# Patient Record
Sex: Male | Born: 1940 | Race: White | Hispanic: No | Marital: Married | Smoking: Former smoker
Health system: Southern US, Community
[De-identification: ages and names within clinical notes are randomized; demographics above are authoritative.]

## PROBLEM LIST (undated history)

## (undated) DIAGNOSIS — R39198 Other difficulties with micturition: Secondary | ICD-10-CM

## (undated) DIAGNOSIS — H3530 Unspecified macular degeneration: Secondary | ICD-10-CM

## (undated) HISTORY — PX: KNEE ARTHROSCOPY: SHX127

## (undated) HISTORY — PX: CATARACT EXTRACTION W/ INTRAOCULAR LENS  IMPLANT, BILATERAL: SHX1307

---

## 2015-03-27 ENCOUNTER — Other Ambulatory Visit: Payer: Self-pay | Admitting: Orthopedic Surgery

## 2015-03-27 DIAGNOSIS — M1712 Unilateral primary osteoarthritis, left knee: Secondary | ICD-10-CM

## 2015-04-03 ENCOUNTER — Ambulatory Visit: Payer: Medicare Other

## 2015-04-04 ENCOUNTER — Ambulatory Visit
Admission: RE | Admit: 2015-04-04 | Discharge: 2015-04-04 | Disposition: A | Discharge status: Home / Self Care | Payer: Medicare Other | Source: Ambulatory Visit | Attending: Orthopedic Surgery | Admitting: Orthopedic Surgery

## 2015-04-04 DIAGNOSIS — M1712 Unilateral primary osteoarthritis, left knee: Secondary | ICD-10-CM | POA: Insufficient documentation

## 2015-04-16 ENCOUNTER — Encounter
Admission: RE | Admit: 2015-04-16 | Discharge: 2015-04-16 | Disposition: A | Discharge status: Home / Self Care | Payer: Medicare Other | Source: Ambulatory Visit | Attending: Orthopedic Surgery | Admitting: Orthopedic Surgery

## 2015-04-16 ENCOUNTER — Inpatient Hospital Stay: Admission: RE | Admit: 2015-04-16 | Payer: Medicare Other | Source: Ambulatory Visit

## 2015-04-16 DIAGNOSIS — Z01812 Encounter for preprocedural laboratory examination: Secondary | ICD-10-CM | POA: Diagnosis present

## 2015-04-16 DIAGNOSIS — M1712 Unilateral primary osteoarthritis, left knee: Secondary | ICD-10-CM | POA: Diagnosis not present

## 2015-04-16 HISTORY — DX: Other difficulties with micturition: R39.198

## 2015-04-16 HISTORY — DX: Unspecified macular degeneration: H35.30

## 2015-04-16 LAB — CBC
HCT: 43.5 % (ref 40.0–52.0)
Hemoglobin: 14.7 g/dL (ref 13.0–18.0)
MCH: 30.6 pg (ref 26.0–34.0)
MCHC: 33.8 g/dL (ref 32.0–36.0)
MCV: 90.5 fL (ref 80.0–100.0)
PLATELETS: 188 10*3/uL (ref 150–440)
RBC: 4.81 MIL/uL (ref 4.40–5.90)
RDW: 13.1 % (ref 11.5–14.5)
WBC: 8.5 10*3/uL (ref 3.8–10.6)

## 2015-04-16 LAB — BASIC METABOLIC PANEL
Anion gap: 8 (ref 5–15)
BUN: 18 mg/dL (ref 6–20)
CALCIUM: 9.8 mg/dL (ref 8.9–10.3)
CO2: 25 mmol/L (ref 22–32)
Chloride: 107 mmol/L (ref 101–111)
Creatinine, Ser: 0.85 mg/dL (ref 0.61–1.24)
GFR calc Af Amer: >60 mL/min (ref >60)
GLUCOSE: 94 mg/dL (ref 65–99)
POTASSIUM: 4.3 mmol/L (ref 3.5–5.1)
Sodium: 140 mmol/L (ref 135–145)

## 2015-04-16 LAB — URINALYSIS COMPLETE WITH MICROSCOPIC (ARMC ONLY)
BACTERIA UA: NONE SEEN
Bilirubin Urine: NEGATIVE
Glucose, UA: NEGATIVE mg/dL
HGB URINE DIPSTICK: NEGATIVE
Ketones, ur: NEGATIVE mg/dL
LEUKOCYTES UA: NEGATIVE
NITRITE: NEGATIVE
PROTEIN: NEGATIVE mg/dL
SPECIFIC GRAVITY, URINE: 1.018 (ref 1.005–1.030)
pH: 5 (ref 5.0–8.0)

## 2015-04-16 LAB — SEDIMENTATION RATE: SED RATE: 3 mm/h (ref 0–20)

## 2015-04-16 LAB — TYPE AND SCREEN
ABO/RH(D): A NEG
Antibody Screen: NEGATIVE

## 2015-04-16 LAB — PROTIME-INR
INR: 0.97
Prothrombin Time: 13.1 seconds (ref 11.4–15.0)

## 2015-04-16 LAB — SURGICAL PCR SCREEN
MRSA, PCR: NEGATIVE
Staphylococcus aureus: NEGATIVE

## 2015-04-16 LAB — ABO/RH: ABO/RH(D): A NEG

## 2015-04-16 LAB — APTT: aPTT: 29 seconds (ref 24–36)

## 2015-04-16 NOTE — Patient Instructions (Signed)
  Your procedure is scheduled on: 04/29/15 Tues  Report to Day Surgery.2nd floor medical mall To find out your arrival time please call [phone] between 1PM - 3PM on 04/28/15 Mon.  Remember: Instructions that are not followed completely may result in serious medical risk, up to and including death, or upon the discretion of your surgeon and anesthesiologist your surgery may need to be rescheduled.    __x__ 1. Do not eat food or drink liquids after midnight. No gum chewing or hard candies.     _x___ 2. No Alcohol for 24 hours before or after surgery.   ____ 3. Bring all medications with you on the day of surgery if instructed.    __x__ 4. Notify your doctor if there is any change in your medical condition     (cold, fever, infections).     Do not wear jewelry, make-up, hairpins, clips or nail polish.  Do not wear lotions, powders, or perfumes. You may wear deodorant.  Do not shave 48 hours prior to surgery. Men may shave face and neck.  Do not bring valuables to the hospital.    [facility] is not responsible for any belongings or valuables.               Contacts, dentures or bridgework may not be worn into surgery.  Leave your suitcase in the car. After surgery it may be brought to your room.  For patients admitted to the hospital, discharge time is determined by your                treatment team.   Patients discharged the day of surgery will not be allowed to drive home.   Please read over the following fact sheets that you were given:   MRSA Information   __x__ Take these medicines the morning of surgery with A SIP OF WATER:    1. Tramadol  2. timolol (TIMOPTIC) 0.5 % ophthalmic solution  3.   4.  5.  6.  ____ Fleet Enema (as directed)   x____ Use CHG Soap as directed  ____ Use inhalers on the day of surgery  ____ Stop metformin 2 days prior to surgery    ____ Take 1/2 of usual insulin dose the night before surgery and none on the morning of surgery.   _x___  Stop Coumadin/Plavix/aspirin on stop aspirin 1 week before surgery  _x___ Stop Anti-inflammatories on naproxen (NAPROSYN) 375 MG tablet stop 1 week before surgery   ____ Stop supplements until after surgery.    ____ Bring C-Pap to the hospital.

## 2015-04-17 LAB — URINE CULTURE: Culture: 4000

## 2015-04-29 ENCOUNTER — Inpatient Hospital Stay: Payer: Medicare Other | Admitting: Anesthesiology

## 2015-04-29 ENCOUNTER — Inpatient Hospital Stay
Admission: RE | Admit: 2015-04-29 | Discharge: 2015-05-02 | DRG: 470 | Disposition: A | Discharge status: Skilled Nursing Facility | Payer: Medicare Other | Source: Ambulatory Visit | Attending: Orthopedic Surgery | Admitting: Orthopedic Surgery

## 2015-04-29 ENCOUNTER — Inpatient Hospital Stay: Payer: Medicare Other

## 2015-04-29 ENCOUNTER — Encounter
Admission: RE | Disposition: A | Discharge status: Skilled Nursing Facility | Payer: Self-pay | Source: Ambulatory Visit | Attending: Orthopedic Surgery

## 2015-04-29 DIAGNOSIS — E785 Hyperlipidemia, unspecified: Secondary | ICD-10-CM | POA: Diagnosis present

## 2015-04-29 DIAGNOSIS — M1710 Unilateral primary osteoarthritis, unspecified knee: Secondary | ICD-10-CM | POA: Diagnosis present

## 2015-04-29 DIAGNOSIS — D62 Acute posthemorrhagic anemia: Secondary | ICD-10-CM | POA: Diagnosis not present

## 2015-04-29 DIAGNOSIS — Z7982 Long term (current) use of aspirin: Secondary | ICD-10-CM

## 2015-04-29 DIAGNOSIS — G8918 Other acute postprocedural pain: Secondary | ICD-10-CM

## 2015-04-29 DIAGNOSIS — Z79891 Long term (current) use of opiate analgesic: Secondary | ICD-10-CM | POA: Diagnosis not present

## 2015-04-29 DIAGNOSIS — H3530 Unspecified macular degeneration: Secondary | ICD-10-CM | POA: Diagnosis present

## 2015-04-29 DIAGNOSIS — M1712 Unilateral primary osteoarthritis, left knee: Principal | ICD-10-CM | POA: Diagnosis present

## 2015-04-29 DIAGNOSIS — Z79899 Other long term (current) drug therapy: Secondary | ICD-10-CM | POA: Diagnosis not present

## 2015-04-29 HISTORY — PX: TOTAL KNEE ARTHROPLASTY: SHX125

## 2015-04-29 LAB — CREATININE, SERUM
CREATININE: 1.03 mg/dL (ref 0.61–1.24)
GFR calc Af Amer: >60 mL/min (ref >60)

## 2015-04-29 LAB — CBC
HCT: 39.5 % — ABNORMAL LOW (ref 40.0–52.0)
HEMOGLOBIN: 13.3 g/dL (ref 13.0–18.0)
MCH: 30.4 pg (ref 26.0–34.0)
MCHC: 33.6 g/dL (ref 32.0–36.0)
MCV: 90.6 fL (ref 80.0–100.0)
Platelets: 152 10*3/uL (ref 150–440)
RBC: 4.36 MIL/uL — ABNORMAL LOW (ref 4.40–5.90)
RDW: 13.2 % (ref 11.5–14.5)
WBC: 5.9 10*3/uL (ref 3.8–10.6)

## 2015-04-29 SURGERY — ARTHROPLASTY, KNEE, TOTAL
Anesthesia: Spinal | Laterality: Left | Wound class: Clean

## 2015-04-29 MED ORDER — ACETAMINOPHEN 325 MG PO TABS: 650.0000 mg | ORAL_TABLET | Freq: Four times a day (QID) | ORAL | Status: DC | PRN

## 2015-04-29 MED ORDER — ASPIRIN EC 81 MG PO TBEC
81.0000 mg | DELAYED_RELEASE_TABLET | Freq: Every day | ORAL | Status: DC
  Administered 2015-04-30 – 2015-05-02 (×3): 81 mg via ORAL
  Filled 2015-04-29 (×3): qty 1

## 2015-04-29 MED ORDER — FAMOTIDINE 20 MG PO TABS
ORAL_TABLET | ORAL | Status: AC
  Administered 2015-04-29: 20 mg via ORAL
  Filled 2015-04-29: qty 1

## 2015-04-29 MED ORDER — ENOXAPARIN SODIUM 30 MG/0.3ML ~~LOC~~ SOLN
30.0000 mg | Freq: Two times a day (BID) | SUBCUTANEOUS | Status: DC
  Administered 2015-04-30 – 2015-05-02 (×5): 30 mg via SUBCUTANEOUS
  Filled 2015-04-29 (×5): qty 0.3

## 2015-04-29 MED ORDER — SODIUM CHLORIDE 0.9 % IJ SOLN
INTRAMUSCULAR | Status: AC
  Filled 2015-04-29: qty 50

## 2015-04-29 MED ORDER — BUPIVACAINE-EPINEPHRINE (PF) 0.25% -1:200000 IJ SOLN
INTRAMUSCULAR | Status: AC
  Filled 2015-04-29: qty 30

## 2015-04-29 MED ORDER — SODIUM CHLORIDE 0.9 % IV SOLN
INTRAVENOUS | Status: DC
  Administered 2015-04-29 – 2015-04-30 (×2): via INTRAVENOUS

## 2015-04-29 MED ORDER — CEFAZOLIN SODIUM-DEXTROSE 2-3 GM-% IV SOLR
2.0000 g | Freq: Once | INTRAVENOUS | Status: AC
  Administered 2015-04-29: 2 g via INTRAVENOUS

## 2015-04-29 MED ORDER — BUPIVACAINE LIPOSOME 1.3 % IJ SUSP
INTRAMUSCULAR | Status: DC | PRN
  Administered 2015-04-29: 60 mL

## 2015-04-29 MED ORDER — METHOCARBAMOL 500 MG PO TABS
500.0000 mg | ORAL_TABLET | Freq: Four times a day (QID) | ORAL | Status: DC | PRN
  Administered 2015-04-29: 500 mg via ORAL
  Filled 2015-04-29: qty 1

## 2015-04-29 MED ORDER — PHENOL 1.4 % MT LIQD
1.0000 | OROMUCOSAL | Status: DC | PRN
  Filled 2015-04-29: qty 177

## 2015-04-29 MED ORDER — FENTANYL CITRATE (PF) 100 MCG/2ML IJ SOLN: 25.0000 ug | INTRAMUSCULAR | Status: DC | PRN

## 2015-04-29 MED ORDER — LACTATED RINGERS IV SOLN
INTRAVENOUS | Status: DC
  Administered 2015-04-29 (×2): via INTRAVENOUS

## 2015-04-29 MED ORDER — TRAMADOL HCL 50 MG PO TABS: 100.0000 mg | ORAL_TABLET | Freq: Four times a day (QID) | ORAL | Status: DC

## 2015-04-29 MED ORDER — NEOMYCIN-POLYMYXIN B GU 40-200000 IR SOLN
Status: DC | PRN
  Administered 2015-04-29: 12 mL

## 2015-04-29 MED ORDER — ACETAMINOPHEN 650 MG RE SUPP: 650.0000 mg | Freq: Four times a day (QID) | RECTAL | Status: DC | PRN

## 2015-04-29 MED ORDER — FENTANYL CITRATE (PF) 100 MCG/2ML IJ SOLN
INTRAMUSCULAR | Status: DC | PRN
  Administered 2015-04-29: 50 ug via INTRAVENOUS

## 2015-04-29 MED ORDER — ONDANSETRON HCL 4 MG/2ML IJ SOLN: 4.0000 mg | Freq: Once | INTRAMUSCULAR | Status: DC | PRN

## 2015-04-29 MED ORDER — METHOCARBAMOL 1000 MG/10ML IJ SOLN
500.0000 mg | Freq: Four times a day (QID) | INTRAVENOUS | Status: DC | PRN
  Filled 2015-04-29: qty 5

## 2015-04-29 MED ORDER — VITAMIN D (ERGOCALCIFEROL) 1.25 MG (50000 UNIT) PO CAPS
50000.0000 [IU] | ORAL_CAPSULE | ORAL | Status: DC
  Administered 2015-04-30: 50000 [IU] via ORAL
  Filled 2015-04-29: qty 1

## 2015-04-29 MED ORDER — DIPHENHYDRAMINE HCL 12.5 MG/5ML PO ELIX: 12.5000 mg | ORAL_SOLUTION | ORAL | Status: DC | PRN

## 2015-04-29 MED ORDER — TAMSULOSIN HCL 0.4 MG PO CAPS
0.4000 mg | ORAL_CAPSULE | Freq: Every day | ORAL | Status: DC
  Administered 2015-04-30 – 2015-05-02 (×3): 0.4 mg via ORAL
  Filled 2015-04-29 (×3): qty 1

## 2015-04-29 MED ORDER — BUPIVACAINE HCL (PF) 0.5 % IJ SOLN
INTRAMUSCULAR | Status: DC | PRN
  Administered 2015-04-29: 3 mL

## 2015-04-29 MED ORDER — METOCLOPRAMIDE HCL 5 MG/ML IJ SOLN: 5.0000 mg | Freq: Three times a day (TID) | INTRAMUSCULAR | Status: DC | PRN

## 2015-04-29 MED ORDER — TRANEXAMIC ACID 1000 MG/10ML IV SOLN
1000.0000 mg | INTRAVENOUS | Status: AC
  Administered 2015-04-29: 1000 mg via INTRAVENOUS
  Filled 2015-04-29: qty 10

## 2015-04-29 MED ORDER — MORPHINE SULFATE (PF) 2 MG/ML IV SOLN
2.0000 mg | INTRAVENOUS | Status: DC | PRN
  Administered 2015-04-30: 2 mg via INTRAVENOUS
  Filled 2015-04-29 (×2): qty 1

## 2015-04-29 MED ORDER — MIDAZOLAM HCL 5 MG/5ML IJ SOLN
INTRAMUSCULAR | Status: DC | PRN
  Administered 2015-04-29 (×2): 1 mg via INTRAVENOUS

## 2015-04-29 MED ORDER — KETOROLAC TROMETHAMINE 30 MG/ML IJ SOLN: INTRAMUSCULAR | Status: DC | PRN

## 2015-04-29 MED ORDER — MENTHOL 3 MG MT LOZG
1.0000 | LOZENGE | OROMUCOSAL | Status: DC | PRN
  Filled 2015-04-29: qty 9

## 2015-04-29 MED ORDER — DOCUSATE SODIUM 100 MG PO CAPS
100.0000 mg | ORAL_CAPSULE | Freq: Two times a day (BID) | ORAL | Status: DC
  Administered 2015-04-29 – 2015-05-02 (×6): 100 mg via ORAL
  Filled 2015-04-29 (×6): qty 1

## 2015-04-29 MED ORDER — PROPOFOL 500 MG/50ML IV EMUL
INTRAVENOUS | Status: DC | PRN
  Administered 2015-04-29: 75 ug/kg/min via INTRAVENOUS

## 2015-04-29 MED ORDER — FAMOTIDINE 20 MG PO TABS
20.0000 mg | ORAL_TABLET | Freq: Once | ORAL | Status: AC
  Administered 2015-04-29: 20 mg via ORAL

## 2015-04-29 MED ORDER — BISACODYL 5 MG PO TBEC
5.0000 mg | DELAYED_RELEASE_TABLET | Freq: Every day | ORAL | Status: DC | PRN
  Administered 2015-05-01: 5 mg via ORAL
  Filled 2015-04-29: qty 1

## 2015-04-29 MED ORDER — LIDOCAINE HCL (CARDIAC) 20 MG/ML IV SOLN
INTRAVENOUS | Status: DC | PRN
  Administered 2015-04-29: 80 mg via INTRAVENOUS

## 2015-04-29 MED ORDER — SODIUM CHLORIDE FLUSH 0.9 % IV SOLN
INTRAVENOUS | Status: AC
  Filled 2015-04-29: qty 3

## 2015-04-29 MED ORDER — ATORVASTATIN CALCIUM 20 MG PO TABS
80.0000 mg | ORAL_TABLET | Freq: Every day | ORAL | Status: DC
  Administered 2015-04-29 – 2015-05-01 (×3): 80 mg via ORAL
  Filled 2015-04-29 (×3): qty 4

## 2015-04-29 MED ORDER — NEOMYCIN-POLYMYXIN B GU 40-200000 IR SOLN
Status: AC
  Filled 2015-04-29: qty 20

## 2015-04-29 MED ORDER — MAGNESIUM HYDROXIDE 400 MG/5ML PO SUSP
30.0000 mL | Freq: Every day | ORAL | Status: DC | PRN
  Administered 2015-04-30 – 2015-05-01 (×2): 30 mL via ORAL
  Filled 2015-04-29 (×2): qty 30

## 2015-04-29 MED ORDER — ONDANSETRON HCL 4 MG PO TABS: 4.0000 mg | ORAL_TABLET | Freq: Four times a day (QID) | ORAL | Status: DC | PRN

## 2015-04-29 MED ORDER — OXYCODONE HCL 5 MG PO TABS
5.0000 mg | ORAL_TABLET | ORAL | Status: DC | PRN
  Administered 2015-04-29: 10 mg via ORAL
  Administered 2015-04-29 (×2): 5 mg via ORAL
  Administered 2015-04-30 – 2015-05-01 (×9): 10 mg via ORAL
  Administered 2015-05-02 (×2): 5 mg via ORAL
  Filled 2015-04-29 (×2): qty 2
  Filled 2015-04-29: qty 1
  Filled 2015-04-29 (×4): qty 2
  Filled 2015-04-29: qty 1
  Filled 2015-04-29 (×2): qty 2
  Filled 2015-04-29: qty 1
  Filled 2015-04-29: qty 2
  Filled 2015-04-29 (×2): qty 1
  Filled 2015-04-29: qty 2

## 2015-04-29 MED ORDER — METOCLOPRAMIDE HCL 5 MG PO TABS: 5.0000 mg | ORAL_TABLET | Freq: Three times a day (TID) | ORAL | Status: DC | PRN

## 2015-04-29 MED ORDER — ZOLPIDEM TARTRATE 5 MG PO TABS: 5.0000 mg | ORAL_TABLET | Freq: Every evening | ORAL | Status: DC | PRN

## 2015-04-29 MED ORDER — EPHEDRINE SULFATE 50 MG/ML IJ SOLN
INTRAMUSCULAR | Status: DC | PRN
  Administered 2015-04-29 (×2): 10 mg via INTRAVENOUS

## 2015-04-29 MED ORDER — TIMOLOL MALEATE 0.5 % OP SOLN
1.0000 [drp] | Freq: Two times a day (BID) | OPHTHALMIC | Status: DC
Start: 2015-04-29 — End: 2015-05-02
  Administered 2015-04-29 – 2015-05-02 (×6): 1 [drp] via OPHTHALMIC
  Filled 2015-04-29 (×2): qty 5

## 2015-04-29 MED ORDER — CEFAZOLIN SODIUM-DEXTROSE 2-3 GM-% IV SOLR
INTRAVENOUS | Status: AC
  Filled 2015-04-29: qty 50

## 2015-04-29 MED ORDER — FINASTERIDE 5 MG PO TABS
5.0000 mg | ORAL_TABLET | Freq: Every day | ORAL | Status: DC
  Administered 2015-04-30 – 2015-05-02 (×3): 5 mg via ORAL
  Filled 2015-04-29 (×3): qty 1

## 2015-04-29 MED ORDER — SODIUM CHLORIDE 0.9 % IJ SOLN
INTRAMUSCULAR | Status: DC | PRN
  Administered 2015-04-29: 60 mL via INTRAVENOUS

## 2015-04-29 MED ORDER — MORPHINE SULFATE (PF) 10 MG/ML IV SOLN
INTRAVENOUS | Status: AC
  Filled 2015-04-29: qty 1

## 2015-04-29 MED ORDER — CEFAZOLIN SODIUM-DEXTROSE 2-3 GM-% IV SOLR
2.0000 g | Freq: Four times a day (QID) | INTRAVENOUS | Status: AC
  Administered 2015-04-29 (×2): 2 g via INTRAVENOUS
  Filled 2015-04-29 (×2): qty 50

## 2015-04-29 MED ORDER — MAGNESIUM CITRATE PO SOLN
1.0000 | Freq: Once | ORAL | Status: AC | PRN
  Administered 2015-05-01: 1 via ORAL
  Filled 2015-04-29 (×2): qty 296

## 2015-04-29 MED ORDER — ONDANSETRON HCL 4 MG/2ML IJ SOLN: 4.0000 mg | Freq: Four times a day (QID) | INTRAMUSCULAR | Status: DC | PRN

## 2015-04-29 MED ORDER — BUPIVACAINE LIPOSOME 1.3 % IJ SUSP
INTRAMUSCULAR | Status: AC
  Filled 2015-04-29: qty 20

## 2015-04-29 SURGICAL SUPPLY — 54 items
BANDAGE ACE 6X5 VEL STRL LF (GAUZE/BANDAGES/DRESSINGS) ×3 IMPLANT
BLADE SAW 1 (BLADE) ×3 IMPLANT
BLOCK CUTTING TIBIAL 4 MED (MISCELLANEOUS) IMPLANT
BLOCK CUTTING TIBIAL 5 LT (MISCELLANEOUS) IMPLANT
CANISTER SUCT 1200ML W/VALVE (MISCELLANEOUS) ×3 IMPLANT
CANISTER SUCT 3000ML (MISCELLANEOUS) ×6 IMPLANT
CAPT KNEE TOTAL 3 ×3 IMPLANT
CATH FOL LEG HOLDER (MISCELLANEOUS) ×3 IMPLANT
CATH TRAY METER 16FR LF (MISCELLANEOUS) ×3 IMPLANT
CEMENT HV SMART SET (Cement) ×6 IMPLANT
CHLORAPREP W/TINT 26ML (MISCELLANEOUS) ×6 IMPLANT
COOLER POLAR GLACIER W/PUMP (MISCELLANEOUS) ×3 IMPLANT
DRAPE INCISE IOBAN 66X45 STRL (DRAPES) ×6 IMPLANT
DRAPE SHEET LG 3/4 BI-LAMINATE (DRAPES) ×6 IMPLANT
ELECT CAUTERY BLADE 6.4 (BLADE) ×3 IMPLANT
ELECT REM PT RETURN 9FT ADLT (ELECTROSURGICAL) ×3
ELECTRODE REM PT RTRN 9FT ADLT (ELECTROSURGICAL) ×1 IMPLANT
GAUZE PETRO XEROFOAM 1X8 (MISCELLANEOUS) ×3 IMPLANT
GAUZE SPONGE 4X4 12PLY STRL (GAUZE/BANDAGES/DRESSINGS) ×3 IMPLANT
GLOVE BIOGEL PI IND STRL 9 (GLOVE) ×1 IMPLANT
GLOVE BIOGEL PI INDICATOR 9 (GLOVE) ×2
GLOVE SURG ORTHO 9.0 STRL STRW (GLOVE) ×3 IMPLANT
GOWN SPECIALTY ULTRA XL (MISCELLANEOUS) ×3 IMPLANT
GOWN STRL REUS W/ TWL LRG LVL3 (GOWN DISPOSABLE) ×2 IMPLANT
GOWN STRL REUS W/TWL LRG LVL3 (GOWN DISPOSABLE) ×4
HANDPIECE SUCTION TUBG SURGILV (MISCELLANEOUS) ×3 IMPLANT
HOOD PEEL AWAY FLYTE STAYCOOL (MISCELLANEOUS) ×6 IMPLANT
IMMBOLIZER KNEE 19 BLUE UNIV (SOFTGOODS) ×3 IMPLANT
IV SET EXTENSION 6 LL TADAPT (SET/KITS/TRAYS/PACK) ×3 IMPLANT
KNEE MEDACTA TIBIAL/FEMORAL BL (Knees) ×3 IMPLANT
KNIFE SCULPS 14X20 (INSTRUMENTS) ×3 IMPLANT
NDL SAFETY 18GX1.5 (NEEDLE) ×3 IMPLANT
NEEDLE SPNL 18GX3.5 QUINCKE PK (NEEDLE) ×3 IMPLANT
NEEDLE SPNL 20GX3.5 QUINCKE YW (NEEDLE) ×3 IMPLANT
NS IRRIG 1000ML POUR BTL (IV SOLUTION) ×3 IMPLANT
PACK TOTAL KNEE (MISCELLANEOUS) ×3 IMPLANT
PAD WRAPON POLAR KNEE (MISCELLANEOUS) ×1 IMPLANT
SOL .9 NS 3000ML IRR  AL (IV SOLUTION) ×2
SOL .9 NS 3000ML IRR UROMATIC (IV SOLUTION) ×1 IMPLANT
STAPLER SKIN PROX 35W (STAPLE) ×3 IMPLANT
STEM EXTENSION 11MMX30MM (Stem) ×3 IMPLANT
STRAP SAFETY BODY (MISCELLANEOUS) ×3 IMPLANT
SUCTION FRAZIER HANDLE 10FR (MISCELLANEOUS) ×2
SUCTION TUBE FRAZIER 10FR DISP (MISCELLANEOUS) ×1 IMPLANT
SUT DVC 2 QUILL PDO  T11 36X36 (SUTURE) ×2
SUT DVC 2 QUILL PDO T11 36X36 (SUTURE) ×1 IMPLANT
SUT DVC QUILL MONODERM 30X30 (SUTURE) ×3 IMPLANT
SUT ETHIBOND NAB CT1 #1 30IN (SUTURE) ×3 IMPLANT
SYR 20CC LL (SYRINGE) ×3 IMPLANT
SYR 50ML LL SCALE MARK (SYRINGE) ×3 IMPLANT
TIBIAL BONE MODEL LEFT (MISCELLANEOUS) IMPLANT
TOWER CARTRIDGE SMART MIX (DISPOSABLE) ×3 IMPLANT
WATER STERILE IRR 1000ML POUR (IV SOLUTION) ×3 IMPLANT
WRAPON POLAR PAD KNEE (MISCELLANEOUS) ×3

## 2015-04-29 NOTE — Anesthesia Procedure Notes (Signed)
Spinal Patient location during procedure: OR Staffing Anesthesiologist: Berdine Addison Performed by: anesthesiologist  Preanesthetic Checklist Completed: patient identified, site marked, surgical consent, pre-op evaluation, timeout performed, IV checked and risks and benefits discussed Spinal Block Patient position: sitting Prep: Betadine Patient monitoring: heart rate, cardiac monitor, continuous pulse ox and blood pressure Approach: midline Location: L3-4 Injection technique: single-shot Needle Needle type: Pencil-Tip  Needle gauge: 25 G Needle length: 9 cm Assessment Sensory level: T10 Additional Notes 0.5% marcaine 3 ml at 0722 am.

## 2015-04-29 NOTE — OR Nursing (Signed)
Sacral pad sent to OR 

## 2015-04-29 NOTE — NC FL2 (Signed)
[place] MEDICAID FL2 LEVEL OF CARE SCREENING TOOL     IDENTIFICATION  Patient Name: Jordan Wilkins Birthdate: Nov 08, 1940 Sex: male Admission Date (Current Location): 04/29/2015  [place] and [place] Number:   Octavio Mann[place], [place] )   Facility and Address:  [facility], [address], [place], [place] [address]      Provider Number: [id number]  Attending Physician Name and Address:  Kennedy Bucker, MD  Relative Name and Phone Number:       Current Level of Care: Hospital Recommended Level of Care: Skilled Nursing Facility Prior Approval Number:    Date Approved/Denied:   PASRR Number:    Discharge Plan: SNF    Current Diagnoses: Patient Active Problem List   Diagnosis Date Noted  . Primary osteoarthritis of knee 04/29/2015  Hyperlipidemia, unspecified  Orientation RESPIRATION BLADDER Height & Weight     Self, Time, Situation, Place  O2 (2 Liters Oxygen ) Continent Weight: 250 lb (113.399 kg) Height:   (177.8 cm)  BEHAVIORAL SYMPTOMS/MOOD NEUROLOGICAL BOWEL NUTRITION STATUS   (none )  (none ) Continent Diet (Diet: Clear Liquid )  AMBULATORY STATUS COMMUNICATION OF NEEDS Skin   Extensive Assist Verbally Surgical wounds (Incision: Left Knee )                       Personal Care Assistance Level of Assistance  Bathing, Feeding, Dressing Bathing Assistance: Limited assistance Feeding assistance: Independent Dressing Assistance: Limited assistance     Functional Limitations Info  Sight, Hearing, Speech Sight Info: Adequate Hearing Info: Adequate Speech Info: Adequate    SPECIAL CARE FACTORS FREQUENCY  PT (By licensed PT), OT (By licensed OT)     PT Frequency:  (5) OT Frequency:  (5)            Contractures      Additional Factors Info  Code Status Code Status Info:  (Full Code. )             Current Medications (04/29/2015):  This is the current hospital active medication list Current Facility-Administered  Medications  Medication Dose Route Frequency Provider Last Rate Last Dose  . 0.9 %  sodium chloride infusion   Intravenous Continuous Kennedy Bucker, MD      . acetaminophen (TYLENOL) tablet 650 mg  650 mg Oral Q6H PRN Kennedy Bucker, MD       Or  . acetaminophen (TYLENOL) suppository 650 mg  650 mg Rectal Q6H PRN Kennedy Bucker, MD      . aspirin EC tablet 81 mg  81 mg Oral Daily Kennedy Bucker, MD      . atorvastatin (LIPITOR) tablet 80 mg  80 mg Oral q1800 Kennedy Bucker, MD      . bisacodyl (DULCOLAX) EC tablet 5 mg  5 mg Oral Daily PRN Kennedy Bucker, MD      . ceFAZolin (ANCEF) 2-3 GM-% IVPB SOLR           . ceFAZolin (ANCEF) IVPB 2 g/50 mL premix  2 g Intravenous Q6H Kennedy Bucker, MD      . diphenhydrAMINE (BENADRYL) 12.5 MG/5ML elixir 12.5-25 mg  12.5-25 mg Oral Q4H PRN Kennedy Bucker, MD      . docusate sodium (COLACE) capsule 100 mg  100 mg Oral BID Kennedy Bucker, MD      . Melene Muller ON 04/30/2015] enoxaparin (LOVENOX) injection 30 mg  30 mg Subcutaneous Q12H Kennedy Bucker, MD      . finasteride (PROSCAR) tablet 5 mg  5 mg Oral Daily Kennedy Bucker, MD      . magnesium citrate solution 1 Bottle  1 Bottle Oral Once PRN Kennedy Bucker, MD      . magnesium hydroxide (MILK OF MAGNESIA) suspension 30 mL  30 mL Oral Daily PRN Kennedy Bucker, MD      . menthol-cetylpyridinium (CEPACOL) lozenge 3 mg  1 lozenge Oral PRN Kennedy Bucker, MD       Or  . phenol (CHLORASEPTIC) mouth spray 1 spray  1 spray Mouth/Throat PRN Kennedy Bucker, MD      . methocarbamol (ROBAXIN) tablet 500 mg  500 mg Oral Q6H PRN Kennedy Bucker, MD       Or  . methocarbamol (ROBAXIN) 500 mg in dextrose 5 % 50 mL IVPB  500 mg Intravenous Q6H PRN Kennedy Bucker, MD      . metoCLOPramide (REGLAN) tablet 5-10 mg  5-10 mg Oral Q8H PRN Kennedy Bucker, MD       Or  . metoCLOPramide (REGLAN) injection 5-10 mg  5-10 mg Intravenous Q8H PRN Kennedy Bucker, MD      . morphine 2 MG/ML injection 2 mg  2 mg Intravenous Q2H PRN Kennedy Bucker, MD      . ondansetron  [facility]) tablet 4 mg  4 mg Oral Q6H PRN Kennedy Bucker, MD       Or  . ondansetron [facility]) injection 4 mg  4 mg Intravenous Q6H PRN Kennedy Bucker, MD      . oxyCODONE (Oxy IR/ROXICODONE) immediate release tablet 5-10 mg  5-10 mg Oral Q3H PRN Kennedy Bucker, MD      . sodium chloride flush 0.9 % injection           . tamsulosin (FLOMAX) capsule 0.4 mg  0.4 mg Oral Daily Kennedy Bucker, MD      . timolol (TIMOPTIC) 0.5 % ophthalmic solution 1 drop  1 drop Left Eye BID Kennedy Bucker, MD      . traMADol Janean Sark) tablet 100 mg  100 mg Oral 4 times per day Kennedy Bucker, MD      . Vitamin D (Ergocalciferol) (DRISDOL) capsule 50,000 Units  50,000 Units Oral Weekly Kennedy Bucker, MD      . zolpidem [facility]) tablet 5 mg  5 mg Oral QHS PRN Kennedy Bucker, MD         Discharge Medications: Please see discharge summary for a list of discharge medications.  Relevant Imaging Results:  Relevant Lab Results:   Additional Information  (SSN: [id number])  Haig Prophet, LCSW

## 2015-04-29 NOTE — H&P (Signed)
Reviewed paper H+P, will be scanned into chart. No changes noted.  

## 2015-04-29 NOTE — Care Management (Addendum)
Rolling walker requested from [facility] with [facility]. Lovenox  #[phone] called in to [facility] for price.

## 2015-04-29 NOTE — Evaluation (Signed)
Physical Therapy Evaluation Patient Details Name: Jordan Wilkins MRN: [id number] DOB: June 11, 1940 Today's Date: 04/29/2015   History of Present Illness  Pt admitted for L TKR.   Clinical Impression  Pt is a pleasant 75 year old male who was admitted for L TKR. Pt performs bed mobility with min assist and transfers/ambulation with cga and rw. Pt demonstrates deficits with mobility/pain/strength. Pt is able to perform 10 SLRs without assistance, therefore does not require KI at this time. All mobility performed on room air with sats WNL. Would benefit from skilled PT to address above deficits and promote optimal return to PLOF.      Follow Up Recommendations Home health PT    Equipment Recommendations  Rolling walker with 5" wheels    Recommendations for Other Services       Precautions / Restrictions Precautions Precautions: Fall;Knee Precaution Booklet Issued: No Restrictions Weight Bearing Restrictions: Yes LLE Weight Bearing: Weight bearing as tolerated      Mobility  Bed Mobility Overal bed mobility: Needs Assistance Bed Mobility: Supine to Sit     Supine to sit: Min assist     General bed mobility comments: safe technique performed with cues for pushing on bed to scoot out to edge. Assist for sliding L LE to floor  Transfers Overall transfer level: Needs assistance Equipment used: Rolling walker (2 wheeled) Transfers: Sit to/from Stand Sit to Stand: Min guard         General transfer comment: transfers performed with rw and safe technique. Once standing, pt able to stand with supervision  Ambulation/Gait Ambulation/Gait assistance: Min guard Ambulation Distance (Feet): 3 Feet Assistive device: Rolling walker (2 wheeled) Gait Pattern/deviations: Step-to pattern     General Gait Details: ambulated to recliner with safe technique. Step to gait pattern performed.   Stairs            Wheelchair Mobility    Modified Rankin (Stroke Patients Only)       Balance Overall balance assessment: Needs assistance Sitting-balance support: Feet supported Sitting balance-Leahy Scale: Good     Standing balance support: Bilateral upper extremity supported Standing balance-Leahy Scale: Good                               Pertinent Vitals/Pain Pain Assessment: 0-10 Pain Score: 6  Pain Location: L knee Pain Descriptors / Indicators: Operative site guarding Pain Intervention(s): Limited activity within patient's tolerance;Premedicated before session;Ice applied    Home Living Family/patient expects to be discharged to:: Private residence Living Arrangements: Spouse/significant other Available Help at Discharge: Family Type of Home: House Home Access: Stairs to enter Entrance Stairs-Rails: None Entrance Stairs-Number of Steps: 1 Home Layout:  (wants to live in basement with whole flight of stairs) Home Equipment: Walker - 4 wheels;Walker - standard      Prior Function Level of Independence: Independent               Hand Dominance        Extremity/Trunk Assessment   Upper Extremity Assessment: Overall WFL for tasks assessed           Lower Extremity Assessment: Generalized weakness (L LE grossly 3/5; R LE grossly 5/5)         Communication   Communication: No difficulties  Cognition Arousal/Alertness: Awake/alert Behavior During Therapy: WFL for tasks assessed/performed Overall Cognitive Status: Within Functional Limits for tasks assessed  General Comments      Exercises Total Joint Exercises Goniometric ROM: L knee AAROM: 4-72 Other Exercises Other Exercises: Pt performed supine ther-ex including L LE ankle pumps, quad sets, SLRs, hip abd/add, and knee flexion. ALl ther-ex performed x 10 reps with cga for technique.      Assessment/Plan    PT Assessment Patient needs continued PT services  PT Diagnosis Difficulty walking;Generalized weakness;Acute pain   PT  Problem List Decreased strength;Decreased range of motion;Decreased activity tolerance;Decreased balance;Decreased mobility;Pain  PT Treatment Interventions Gait training;Therapeutic exercise   PT Goals (Current goals can be found in the Care Plan section) Acute Rehab PT Goals Patient Stated Goal: to go home PT Goal Formulation: With patient Time For Goal Achievement: 05/13/15 Potential to Achieve Goals: Good    Frequency BID   Barriers to discharge        Co-evaluation               End of Session Equipment Utilized During Treatment: Gait belt Activity Tolerance: Patient tolerated treatment well Patient left: in chair;with chair alarm set Nurse Communication: Mobility status         Time: 1453-1530 PT Time Calculation (min) (ACUTE ONLY): 37 min   Charges:   PT Evaluation $PT Eval Moderate Complexity: 1 Procedure PT Treatments $Therapeutic Exercise: 8-22 mins   PT G Codes:        Jaber Dunlow May 01, 2015, 5:17 PM  Elizabeth Palau, PT, DPT [phone]

## 2015-04-29 NOTE — OR Nursing (Signed)
Nickel size area on left knee where patient fell on 04/16/2015.  No open area or drainage noted.  Patient states that Dr. Rosita Kea has seen it and I will also ask him to assess it this morning.

## 2015-04-29 NOTE — Op Note (Signed)
04/29/2015  9:48 AM  PATIENT:  Jordan Wilkins  75 y.o. male  PRE-OPERATIVE DIAGNOSIS:  degenerative osteoarthritis left knee  POST-OPERATIVE DIAGNOSIS:  degenerative osteoarthritis left knee  PROCEDURE:  Procedure(s): TOTAL KNEE ARTHROPLASTY (Left)  SURGEON: Leitha Schuller, MD  ASSISTANTS: Cranston Neighbor, PA-C  ANESTHESIA:   spinal  EBL:  Total I/O In: 1111 [I.V.:1111] Out: 300 [Urine:250; Blood:50]  BLOOD ADMINISTERED:none  DRAINS: none   LOCAL MEDICATIONS USED:  MARCAINE    and OTHER Toradol morphine and Exparel  SPECIMEN:  Source of Specimen:  Cut ends of bone left knee  DISPOSITION OF SPECIMEN:  PATHOLOGY  COUNTS:  YES  TOURNIQUET:   90 minutes at 300 mmHg  IMPLANTS: Medacta GMK sphere left 5 femur, left 5 tibia with stem, 10 mm insert and 3 patella all components cemented  DICTATION: .Dragon Dictation patient brought the operating room and after adequate spinal anesthesia was obtained, the left leg was prepped and draped in sterile fashion. After patient identification timeout procedures were completed the tourniquet was raised to 300 mmHg. Next the midline skin incision was made followed by medial parapatellar arthrotomy. Inspection revealed eburnated bone in the medial compartment and extensive bone loss on the lateral patella facet and lateral trochlea with eburnated bone there as well. Anterior cruciate ligament and fat pad were excised and the proximal tibia was exposed for application the Medacta cutting guide. Proximal tibia cut was carried out with resection of the PCL and menisci. Distal femoral cut was then made using Medacta cutting guide and the 5 cutting guide was applied anterior posterior chamfer cuts made. The trials 10 mm insert was snug. Next the distal femoral drill holes were made followed by the trochlear groove cut. Tibial preparation to be carried out with proximal drill hole and for preparation for short stem. With the patella everted patellar cut was  made resecting approximately 9 mm cutting through the sclerotic bone laterally. The drill holes were made and the patella sized to size 3. The bony surfaces were thoroughly irrigated and dried with infiltration of a Combination of drugs noted above throughout the knee. Next the components were cemented into place with the 10 mm insert. She was obtained with excellent patellofemoral tracking. After the cement had set excess cement was removed and the knee thoroughly irrigated with initially but Betadine lavage then pulse lavaged. Tourniquet was let down at 90 minutes and there is no significant bleeding and the knee. The arthrotomy was repaired using a heavy Quill followed by 2-0 Quill substantially for by skin staples. Xeroform 4 x 4's ABDs and web roll and Ace wrap applied along [facility] comes stable condition  PLAN OF CARE: Admit to inpatient   PATIENT DISPOSITION:  PACU - hemodynamically stable.

## 2015-04-29 NOTE — Anesthesia Preprocedure Evaluation (Addendum)
Anesthesia Evaluation  Patient identified by MRN, date of birth, ID band Patient awake    Reviewed: Allergy & Precautions, NPO status , Patient's Chart, lab work & pertinent test results, reviewed documented beta blocker date and time   Airway Mallampati: III  TM Distance: >3 FB     Dental  (+) Chipped, Dental Advisory Given, Missing   Pulmonary former smoker,           Cardiovascular      Neuro/Psych    GI/Hepatic   Endo/Other    Renal/GU      Musculoskeletal   Abdominal   Peds  Hematology   Anesthesia Other Findings Obese. Could be difficult to intubate.  Reproductive/Obstetrics                            Anesthesia Physical Anesthesia Plan  ASA: III  Anesthesia Plan: Spinal   Post-op Pain Management:    Induction:   Airway Management Planned:   Additional Equipment:   Intra-op Plan:   Post-operative Plan:   Informed Consent: I have reviewed the patients History and Physical, chart, labs and discussed the procedure including the risks, benefits and alternatives for the proposed anesthesia with the patient or authorized representative who has indicated his/her understanding and acceptance.     Plan Discussed with: CRNA  Anesthesia Plan Comments:         Anesthesia Quick Evaluation

## 2015-04-29 NOTE — Transfer of Care (Signed)
Immediate Anesthesia Transfer of Care Note  Patient: Jordan Wilkins  Procedure(s) Performed: Procedure(s): TOTAL KNEE ARTHROPLASTY (Left)  Patient Location: PACU  Anesthesia Type:Spinal  Level of Consciousness: sedated  Airway & Oxygen Therapy: Patient Spontanous Breathing and Patient connected to face mask oxygen  Post-op Assessment: Report given to RN and Post -op Vital signs reviewed and stable  Post vital signs: Reviewed and stable  Last Vitals: 98.3 temp 99satn 100/64 bp 73 hr 20resp  Filed Vitals:   04/29/15 0605 04/29/15 0611  BP:  141/70  Pulse: 99   Temp: 36.9 C   Resp: 14     Complications: No apparent anesthesia complications

## 2015-04-29 NOTE — Care Management Note (Addendum)
Case Management Note  Patient Details  Name: Jordan Wilkins MRN: [id number] Date of Birth: 03-29-1940  Subjective/Objective:                  Patient just received from PACU. I met with patient's wife to discuss discharge planning. She states that they had talked and if he needs SNF they want [facility]. If he needs home health PT they want [facility] [phone] fax [phone]. He has a 4- wheeled walker and a standard walker but no 2-wheeled walker. He uses [facility]process development [facility] for Rx [phone].  Action/Plan: Referral made to [facility] home health- orders faxed. PT pending. RNCM will follow.    Expected Discharge Date:                  Expected Discharge Plan:     In-House Referral:     Discharge planning Services  CM Consult  Post Acute Care Choice:  Home Health, Durable Medical Equipment Choice offered to:  Patient  DME Arranged:  Walker rolling DME Agency:  [place]:  PT [place] Agency:   ([facility])  Status of Service:  In process, will continue to follow  Medicare Important Message Given:    Date Medicare IM Given:    Medicare IM give by:    Date Additional Medicare IM Given:    Additional Medicare Important Message give by:     If discussed at [place] of Stay Meetings, dates discussed:    Additional Comments:  Marshell Garfinkel, RN 04/29/2015, 10:58 AM

## 2015-04-30 ENCOUNTER — Encounter: Payer: Self-pay | Admitting: Orthopedic Surgery

## 2015-04-30 LAB — CBC
HEMATOCRIT: 39 % — AB (ref 40.0–52.0)
HEMOGLOBIN: 12.8 g/dL — AB (ref 13.0–18.0)
MCH: 30.4 pg (ref 26.0–34.0)
MCHC: 32.9 g/dL (ref 32.0–36.0)
MCV: 92.4 fL (ref 80.0–100.0)
Platelets: 153 10*3/uL (ref 150–440)
RBC: 4.22 MIL/uL — AB (ref 4.40–5.90)
RDW: 13.3 % (ref 11.5–14.5)
WBC: 11.9 10*3/uL — ABNORMAL HIGH (ref 3.8–10.6)

## 2015-04-30 LAB — BASIC METABOLIC PANEL
Anion gap: 6 (ref 5–15)
BUN: 14 mg/dL (ref 6–20)
CHLORIDE: 106 mmol/L (ref 101–111)
CO2: 26 mmol/L (ref 22–32)
CREATININE: 0.86 mg/dL (ref 0.61–1.24)
Calcium: 8.5 mg/dL — ABNORMAL LOW (ref 8.9–10.3)
GFR calc Af Amer: >60 mL/min (ref >60)
GFR calc non Af Amer: >60 mL/min (ref >60)
GLUCOSE: 120 mg/dL — AB (ref 65–99)
POTASSIUM: 4.1 mmol/L (ref 3.5–5.1)
Sodium: 138 mmol/L (ref 135–145)

## 2015-04-30 NOTE — Progress Notes (Signed)
Physical Therapy Treatment Patient Details Name: Jordan Wilkins MRN: [id number] DOB: 1940/09/02 Today's Date: 04/30/2015    History of Present Illness This patient is a 75 year old male who came to [facility] for a L TKR.    PT Comments    Pt is making improved progress compared to previous session. Pt with increased gait distance, however requires 2 seated rest breaks during ambulation. Good endurance noted with there-ex with increased ease this session. Decreased pain in general as described by patient. Per discussion with wife, pt can live on bottom floor of house and does not need to go up/down flight of stairs to basement. Will continue efforts to improve functional independence.  Follow Up Recommendations  Home health PT     Equipment Recommendations  Rolling walker with 5" wheels    Recommendations for Other Services       Precautions / Restrictions Precautions Precautions: Fall;Knee Precaution Booklet Issued: Yes (comment) Restrictions Weight Bearing Restrictions: Yes LLE Weight Bearing: Weight bearing as tolerated    Mobility  Bed Mobility Overal bed mobility: Needs Assistance Bed Mobility: Supine to Sit     Supine to sit: Mod assist     General bed mobility comments: not performed as pt received in recliner  Transfers Overall transfer level: Needs assistance Equipment used: Rolling walker (2 wheeled) Transfers: Sit to/from Stand Sit to Stand: Min assist         General transfer comment: cues needed for correct hand placement prior to transfer. Improved ease noted compared to earlier session. Once standing, pt able to stand with supervision  Ambulation/Gait Ambulation/Gait assistance: Min guard;+2 safety/equipment Ambulation Distance (Feet): 35 Feet Assistive device: Rolling walker (2 wheeled) Gait Pattern/deviations: Step-to pattern     General Gait Details: +2 assist for following with chair as pt fatigues quickly and needs 2 sitting rest breaks during  gait training. Small step to gait pattern performed with antalgic technique. Decreased stance time on L LE secondary to pain.   Stairs            Wheelchair Mobility    Modified Rankin (Stroke Patients Only)       Balance                                    Cognition Arousal/Alertness: Awake/alert Behavior During Therapy: WFL for tasks assessed/performed Overall Cognitive Status: Within Functional Limits for tasks assessed                      Exercises Total Joint Exercises Goniometric ROM: L LE knee AAROM: 4-76 degrees Other Exercises Other Exercises: Seated L LE ther-ex performed including ankle pumps, quad sets, SLR, and hip abd/add x 12 reps with cues for correct technique and cga.    General Comments        Pertinent Vitals/Pain Pain Assessment: 0-10 Pain Score: 6  Pain Location: L knee Pain Descriptors / Indicators: Operative site guarding Pain Intervention(s): Limited activity within patient's tolerance;Premedicated before session;Ice applied    Home Living                      Prior Function            PT Goals (current goals can now be found in the care plan section) Acute Rehab PT Goals Patient Stated Goal: to go home PT Goal Formulation: With patient Time For Goal Achievement: 05/13/15 Potential to  Achieve Goals: Good Progress towards PT goals: Progressing toward goals    Frequency  BID    PT Plan Current plan remains appropriate    Co-evaluation             End of Session Equipment Utilized During Treatment: Gait belt Activity Tolerance: Patient limited by pain Patient left: in chair;with nursing/sitter in room     Time: 1105-1135 PT Time Calculation (min) (ACUTE ONLY): 30 min  Charges:  $Gait Training: 8-22 mins $Therapeutic Exercise: 8-22 mins                    G Codes:      Noemi Ishmael 24-May-2015, 3:09 PM  Elizabeth Palau, PT, DPT [phone]

## 2015-04-30 NOTE — Evaluation (Signed)
Occupational Therapy Evaluation Patient Details Name: Jordan Wilkins MRN: [id number] DOB: 06-13-1940 Today's Date: 04/30/2015    History of Present Illness This patient is a 75 year old male who came to [facility] for a L TKR.   Clinical Impression   This patient is a 75 year old male who came to [facility] for a L total knee replacement.  Patient lives with his wife  in a home with a basement.  He had been independent with ADL and functional mobility. He now has deficits with pain, mobility and activities of daily living. He  now requires  assistance and would benefit from Occupational Therapy for ADL/functional mobility training.      Follow Up Recommendations  SNF (if does not do better with Physical Therapy in second session.)   Equipment Recommendations       Recommendations for Other Services       Precautions / Restrictions Precautions Precautions: Fall;Knee Restrictions Weight Bearing Restrictions: No LLE Weight Bearing: Weight bearing as tolerated      Mobility Bed Mobility                  Transfers                      Balance                                            ADL                                         General ADL Comments: Patient had been independent with ADL with increasing pain.  Practiced techniques for lower body dressing using hip kit as he could not reach his foot. Practiced techniques for Donned/doffed socks and pants to knees (bulky wrap still in place). He needed hand over hand assist to illustrate and verbal cues and physical cues to complete.     Vision     Perception     Praxis      Pertinent Vitals/Pain       Hand Dominance Right   Extremity/Trunk Assessment Upper Extremity Assessment Upper Extremity Assessment: Overall WFL for tasks assessed   Lower Extremity Assessment Lower Extremity Assessment: Defer to PT evaluation       Communication  Communication Communication: No difficulties   Cognition Arousal/Alertness: Awake/alert Behavior During Therapy: WFL for tasks assessed/performed Overall Cognitive Status: Within Functional Limits for tasks assessed                     General Comments       Exercises       Shoulder Instructions      Home Living Family/patient expects to be discharged to:: Private residence Living Arrangements: Spouse/significant other Available Help at Discharge: Family Type of Home: House Home Access: Stairs to enter Technical brewer of Steps: 1 Entrance Stairs-Rails: None Home Layout:  (Wants to live in basement.)               Home Equipment: Walker - 4 wheels;Walker - standard          Prior Functioning/Environment Level of Independence: Independent             OT Diagnosis: Generalized weakness;Acute pain   OT  Problem List: Decreased strength;Decreased range of motion;Decreased activity tolerance;Impaired balance (sitting and/or standing);Decreased safety awareness;Decreased knowledge of use of DME or AE;Pain   OT Treatment/Interventions: Self-care/ADL training    OT Goals(Current goals can be found in the care plan section) Acute Rehab OT Goals Patient Stated Goal: to go home OT Goal Formulation: With patient Time For Goal Achievement: 05/14/15 Potential to Achieve Goals: Good  OT Frequency: Min 1X/week   Barriers to D/C:            Co-evaluation              End of Session Equipment Utilized During Treatment:  (hip kit)  Activity Tolerance:   Patient left: in bed;with call bell/phone within reach;with nursing/sitter in room   Time: 1029-1053 OT Time Calculation (min): 24 min Charges:  OT General Charges $OT Visit: 1 Procedure OT Evaluation $OT Eval Low Complexity: 1 Procedure OT Treatments $Self Care/Home Management : 8-22 mins G-Codes:    Myrene Galas, MS/OTR/L  04/30/2015, 11:08 AM

## 2015-04-30 NOTE — Anesthesia Postprocedure Evaluation (Signed)
Anesthesia Post Note  Patient: Jordan Wilkins  Procedure(s) Performed: Procedure(s) (LRB): TOTAL KNEE ARTHROPLASTY (Left)  Patient location during evaluation: Nursing Unit Anesthesia Type: Spinal Level of consciousness: awake and awake and alert Pain management: pain level not controlled Vital Signs Assessment: vitals unstable and post-procedure vital signs reviewed and stable Respiratory status: spontaneous breathing and nonlabored ventilation Cardiovascular status: stable Postop Assessment: patient able to bend at knees, no signs of nausea or vomiting, no headache and no backache Anesthetic complications: no    Last Vitals:  Filed Vitals:   04/29/15 1927 04/30/15 0409  BP: 128/80 128/70  Pulse: 68 73  Temp: 36.3 C 36.6 C  Resp: 18 22    Last Pain:  Filed Vitals:   04/30/15 0628  PainSc: 3                  Chiropodist

## 2015-04-30 NOTE — Progress Notes (Signed)
Foley d/c'd at 0700. 

## 2015-04-30 NOTE — Progress Notes (Signed)
   Subjective: 1 Day Post-Op Procedure(s) (LRB): TOTAL KNEE ARTHROPLASTY (Left) Patient reports pain as 6 on 0-10 scale.   Patient is well, and has had no acute complaints or problems Denies any CP, SOB, ABD pain. We will continue therapy today.  Plan is to go Home after hospital stay.  Objective: Vital signs in last 24 hours: Temp:  [96.2 F (35.7 C)-98.3 F (36.8 C)] 98 F (36.7 C) (02/15 0740) Pulse Rate:  [53-81] 81 (02/15 0740) Resp:  [16-22] 22 (02/15 0409) BP: (100-141)/(59-80) 141/77 mmHg (02/15 0740) SpO2:  [95 %-100 %] 95 % (02/15 0740)  Intake/Output from previous day: 02/14 0701 - 02/15 0700 In: 2643.5 [I.V.:2593.5; IV Piggyback:50] Out: 3575 [Urine:3525; Blood:50] Intake/Output this shift: Total I/O In: -  Out: 200 [Urine:200]   Recent Labs  04/29/15 1119 04/30/15 0512  HGB 13.3 12.8*    Recent Labs  04/29/15 1119 04/30/15 0512  WBC 5.9 11.9*  RBC 4.36* 4.22*  HCT 39.5* 39.0*  PLT 152 153    Recent Labs  04/29/15 1119 04/30/15 0512  NA  --  138  K  --  4.1  CL  --  106  CO2  --  26  BUN  --  14  CREATININE 1.03 0.86  GLUCOSE  --  120*  CALCIUM  --  8.5*   No results for input(s): LABPT, INR in the last 72 hours.  EXAM General - Patient is Alert, Appropriate and Oriented Extremity - ABD soft Neurovascular intact Sensation intact distally Intact pulses distally Dorsiflexion/Plantar flexion intact Dressing - dressing C/D/I and no drainage Motor Function - intact, moving foot and toes well on exam.   Past Medical History  Diagnosis Date  . Difficulty urinating   . Macular degeneration     both    Assessment/Plan:   1 Day Post-Op Procedure(s) (LRB): TOTAL KNEE ARTHROPLASTY (Left) Active Problems:   Primary osteoarthritis of knee   Acute post op blood loss anemia    Estimated body mass index is 35.87 kg/(m^2) as calculated from the following:   Height as of this encounter:  (1.778 m).   Weight as of this encounter:  113.399 kg (250 lb). Advance diet Up with therapy  Needs BM Recheck labs in the am  DVT Prophylaxis - Lovenox, Foot Pumps and TED hose Weight-Bearing as tolerated to left leg D/C O2 and Pulse OX and try on Room Air  T. Cranston Neighbor, PA-C [facility] 04/30/2015, 8:11 AM

## 2015-04-30 NOTE — Progress Notes (Signed)
Physical Therapy Treatment Patient Details Name: Jordan Wilkins MRN: [id number] DOB: [birth year] Today's Date: 04/30/2015    History of Present Illness This patient is a 75 year old male who came to [facility] for a L TKR.    PT Comments    Pt is making limited progress towards goals and is limited secondary to pain. Pt unable to ambulate to recliner this date. Good endurance with all there-ex, however pt does need increased assist with all mobility. Limited progress with ROM, recommend use of bone foam for improved extension. Will continue to progress mobility in PM session.   Follow Up Recommendations  Home health PT     Equipment Recommendations  Rolling walker with 5" wheels    Recommendations for Other Services       Precautions / Restrictions Precautions Precautions: Fall;Knee Precaution Booklet Issued: Yes (comment) Restrictions Weight Bearing Restrictions: Yes LLE Weight Bearing: Weight bearing as tolerated    Mobility  Bed Mobility Overal bed mobility: Needs Assistance Bed Mobility: Supine to Sit     Supine to sit: Mod assist     General bed mobility comments: Pt requires increased assist at this time for sliding L LE off bed. Assist also required for trunk mobility. Once seated at EOB, pt able to sit with supervison  Transfers Overall transfer level: Needs assistance Equipment used: Rolling walker (2 wheeled) Transfers: Sit to/from Stand Sit to Stand: Min assist         General transfer comment: cues needed for correct hand placement prior to transfer. Once standing, pt unable to bear weight through L LE secondry to pain. Heavy use of B UE on rw  Ambulation/Gait Ambulation/Gait assistance: Min guard Ambulation Distance (Feet): 2 Feet Assistive device: Rolling walker (2 wheeled) Gait Pattern/deviations: Step-to pattern     General Gait Details: Pt attempted ambulation twice, however unable/unwilling to weight bear on L LE secondary to pain. Pt able to take  1 hop in forward/backward direction with minimal touch touching on ground. Unable to ambulate to recliner at this time   Information systems manager Rankin (Stroke Patients Only)       Balance                                    Cognition Arousal/Alertness: Awake/alert Behavior During Therapy: WFL for tasks assessed/performed Overall Cognitive Status: Within Functional Limits for tasks assessed                      Exercises Total Joint Exercises Goniometric ROM: L LE knee AAROM: 4-76 degrees Other Exercises Other Exercises: supine ther-ex performed on L LE including ankle pumps, quad sets, glut sets, hip abd/add, SAQ, and knee flexion stretches. All ther-ex performed x 10 reps with mod assist. Increased pain with all ther-ex    General Comments        Pertinent Vitals/Pain Pain Assessment: 0-10 Pain Score: 8  Pain Location: L knee Pain Descriptors / Indicators: Operative site guarding Pain Intervention(s): Limited activity within patient's tolerance;Premedicated before session;Ice applied    Home Living Family/patient expects to be discharged to:: Private residence Living Arrangements: Spouse/significant other Available Help at Discharge: Family Type of Home: House Home Access: Stairs to enter Entrance Stairs-Rails: None Home Layout:  (Wants to live in basement.) Home Equipment: Walker - 4 wheels;Walker - standard  Prior Function Level of Independence: Independent          PT Goals (current goals can now be found in the care plan section) Acute Rehab PT Goals Patient Stated Goal: to go home PT Goal Formulation: With patient Time For Goal Achievement: 05/13/15 Potential to Achieve Goals: Good Progress towards PT goals: Progressing toward goals    Frequency  BID    PT Plan Current plan remains appropriate    Co-evaluation             End of Session Equipment Utilized During Treatment: Gait  belt Activity Tolerance: Patient limited by pain Patient left: in bed;with bed alarm set     Time: 0912-0950 PT Time Calculation (min) (ACUTE ONLY): 38 min  Charges:  $Gait Training: 8-22 mins $Therapeutic Exercise: 23-37 mins                    G Codes:      Jordan Wilkins 29-May-2015, 12:18 PM  Elizabeth Palau, PT, DPT [phone]

## 2015-04-30 NOTE — Clinical Social Work Note (Signed)
Clinical Social Work Assessment  Patient Details  Name: Jordan Wilkins MRN: [id number] Date of Birth: 16-Feb-1941  Date of referral:  04/30/15               Reason for consult:  Facility Placement                Permission sought to share information with:  Facility Art therapist granted to share information::     Name::        Agency::     Relationship::     Contact Information:     Housing/Transportation Living arrangements for the past 2 months:  Single Family Home Source of Information:  Patient Patient Interpreter Needed:  None Criminal Activity/Legal Involvement Pertinent to Current Situation/Hospitalization:  No - Comment as needed Significant Relationships:  Adult Children, Spouse Lives with:  Spouse Do you feel safe going back to the place where you live?  Yes Need for family participation in patient care:  Yes (Comment)  Care giving concerns:  Patient lives in [place], [place] with his wife Jordan Wilkins.    Social Worker assessment / plan:  Per RN in progression rounds patient is moving slowly with PT and they will likely change recommendation from home health on post op day 0 to SNF on post op day 1. CSW met with patient and no family was at bedside. John OT was at bedside during assessment. CSW introduced self and explained role of CSW department. Patient reported that he lives with his wife Jordan Wilkins of 43 years in [place], [place]. Per patient they have 4 adult children, 8 grandchildren and 1 great grandson. CSW explained SNF process. Patient adamantly refused SNF and reported that he is going home with home health. CSW explained to patient that he will have to work hard with PT and patient's that go home usually walk a lap around the nurses patient and practice on the stairs. Patient verbalized his understanding. RN Case Manager is aware of above. CSW will continue to follow and assist as needed.    Employment status:  Retired Health visitor PT  Recommendations:  Home with [employer], [place] / Referral to community resources:  Other (Comment Required) (Patient is refusing SNF and prefers home health )  Patient/Family's Response to care:  Patient refused SNF.   Patient/Family's Understanding of and Emotional Response to Diagnosis, Current Treatment, and Prognosis:  Patient was pleasant throughout assessment.   Emotional Assessment Appearance:  Appears stated age Attitude/Demeanor/Rapport:    Affect (typically observed):  Pleasant Orientation:  Oriented to Place, Oriented to  Time, Oriented to Situation, Oriented to Self Alcohol / Substance use:  Not Applicable Psych involvement (Current and /or in the community):  No (Comment)  Discharge Needs  Concerns to be addressed:  Discharge Planning Concerns Readmission within the last 30 days:  No Current discharge risk:  Dependent with Mobility Barriers to Discharge:  Continued Medical Work up   Loralyn Freshwater, LCSW 04/30/2015, 11:03 AM

## 2015-04-30 NOTE — Care Management (Addendum)
Per RN patient refusing to work with PT. Lovenox $111.77. I have requested walker from [facility] however they will have to pick walker up if patient does to [facility] at discharge. Dan Humphreys has been delivered. Lovenox will also need to be cancelled. RNCM will continue to follow for home health through [facility].

## 2015-05-01 LAB — CBC
HCT: 33.5 % — ABNORMAL LOW (ref 40.0–52.0)
HEMOGLOBIN: 11.5 g/dL — AB (ref 13.0–18.0)
MCH: 30.7 pg (ref 26.0–34.0)
MCHC: 34.2 g/dL (ref 32.0–36.0)
MCV: 89.7 fL (ref 80.0–100.0)
PLATELETS: 137 10*3/uL — AB (ref 150–440)
RBC: 3.74 MIL/uL — AB (ref 4.40–5.90)
RDW: 13.3 % (ref 11.5–14.5)
WBC: 11.1 10*3/uL — AB (ref 3.8–10.6)

## 2015-05-01 LAB — BASIC METABOLIC PANEL
ANION GAP: 6 (ref 5–15)
BUN: 17 mg/dL (ref 6–20)
CHLORIDE: 98 mmol/L — AB (ref 101–111)
CO2: 30 mmol/L (ref 22–32)
Calcium: 8.4 mg/dL — ABNORMAL LOW (ref 8.9–10.3)
Creatinine, Ser: 1.1 mg/dL (ref 0.61–1.24)
Glucose, Bld: 120 mg/dL — ABNORMAL HIGH (ref 65–99)
POTASSIUM: 3.5 mmol/L (ref 3.5–5.1)
SODIUM: 134 mmol/L — AB (ref 135–145)

## 2015-05-01 LAB — SURGICAL PATHOLOGY

## 2015-05-01 MED ORDER — BISACODYL 10 MG RE SUPP
10.0000 mg | Freq: Every day | RECTAL | Status: DC | PRN
  Administered 2015-05-01: 10 mg via RECTAL

## 2015-05-01 MED ORDER — ENOXAPARIN SODIUM 40 MG/0.4ML ~~LOC~~ SOLN: 40.0000 mg | SUBCUTANEOUS | Status: AC

## 2015-05-01 MED ORDER — TRAMADOL HCL 50 MG PO TABS: 50.0000 mg | ORAL_TABLET | Freq: Four times a day (QID) | ORAL | Status: AC | PRN

## 2015-05-01 MED ORDER — OXYCODONE HCL 5 MG PO TABS: 5.0000 mg | ORAL_TABLET | ORAL | Status: AC | PRN

## 2015-05-01 NOTE — Care Management (Signed)
This RNCM received call from patient's wife nervous about patient riding in private vehicle to [place] SNF location tomorrow morning. She asked that someone please call her today about a plan. I have notified CSW that wife is requesting EMS and to please contact patient's wife.

## 2015-05-01 NOTE — Progress Notes (Signed)
Patient retaining urine, bladder scan was 430. Dr Rosita Kea paged.

## 2015-05-01 NOTE — Discharge Summary (Addendum)
Physician Discharge Summary  Patient ID: Jordan Wilkins MRN: [id number] DOB/AGE: 1941/03/01 75 y.o.  Admit date: 04/29/2015 Discharge date: 05/02/2015  Admission Diagnoses:  degenerative osteoarthritis   Discharge Diagnoses: Patient Active Problem List   Diagnosis Date Noted  . Primary osteoarthritis of knee 04/29/2015    Past Medical History  Diagnosis Date  . Difficulty urinating   . Macular degeneration     both     Transfusion: none   Consultants (if any):    Discharged Condition: Improved  Hospital Course: Jordan Wilkins is an 75 y.o. male who was admitted 04/29/2015 with a diagnosis of left knee OA and went to the operating room on 04/29/2015 and underwent the above named procedures.    Surgeries: Procedure(s): TOTAL KNEE ARTHROPLASTY on 04/29/2015 Patient tolerated the surgery well. Taken to PACU where she was stabilized and then transferred to the orthopedic floor.  Started on Lovenox 30 q 12 hrs. Foot pumps applied bilaterally at 80 mm. Heels elevated on bed with rolled towels. No evidence of DVT. Negative Homan. Physical therapy started on day #1 for gait training and transfer. OT started day #1 for ADL and assisted devices.  Patient's foley was d/c on day #1. Patient's IV  was d/c on day #2.  On post op day #3 patient was stable and ready for discharge to SNF.  Implants: Medacta GMK sphere left 5 femur, left 5 tibia with stem, 10 mm insert and 3 patella all components cemented  He was given perioperative antibiotics:  Anti-infectives    Start     Dose/Rate Route Frequency Ordered Stop   04/29/15 1100  ceFAZolin (ANCEF) IVPB 2 g/50 mL premix     2 g 100 mL/hr over 30 Minutes Intravenous Every 6 hours 04/29/15 1046 04/29/15 1902   04/29/15 0555  ceFAZolin (ANCEF) 2-3 GM-% IVPB SOLR    Comments:  KENNEDY, ASHLEY: cabinet override      04/29/15 0555 04/29/15 1759   04/29/15 0315  ceFAZolin (ANCEF) IVPB 2 g/50 mL premix     2 g 100 mL/hr over 30 Minutes  Intravenous  Once 04/29/15 0314 04/29/15 0733    .  He was given sequential compression devices, early ambulation, and lovenox for DVT prophylaxis.  He benefited maximally from the hospital stay and there were no complications.    Recent vital signs:  Filed Vitals:   05/01/15 0830 05/01/15 0834  BP: 105/40 111/48  Pulse: 69   Temp: 97.7 F (36.5 C)   Resp: 18     Recent laboratory studies:  Lab Results  Component Value Date   HGB 11.5* 05/01/2015   HGB 12.8* 04/30/2015   HGB 13.3 04/29/2015   Lab Results  Component Value Date   WBC 11.1* 05/01/2015   PLT 137* 05/01/2015   Lab Results  Component Value Date   INR 0.97 04/16/2015   Lab Results  Component Value Date   NA 134* 05/01/2015   K 3.5 05/01/2015   CL 98* 05/01/2015   CO2 30 05/01/2015   BUN 17 05/01/2015   CREATININE 1.10 05/01/2015   GLUCOSE 120* 05/01/2015    Discharge Medications:     Medication List    TAKE these medications        aspirin 81 MG tablet  Take 81 mg by mouth daily.     atorvastatin 80 MG tablet  Commonly known as:  LIPITOR  Take 80 mg by mouth daily at 6 PM.     enoxaparin 40 MG/0.4ML injection  Commonly  known as:  LOVENOX  Inject 0.4 mLs (40 mg total) into the skin daily.     ergocalciferol 50000 units capsule  Commonly known as:  VITAMIN D2  Take 50,000 Units by mouth once a week.     finasteride 5 MG tablet  Commonly known as:  PROSCAR  Take 5 mg by mouth daily.     naproxen 375 MG tablet  Commonly known as:  NAPROSYN  Take 375 mg by mouth 2 (two) times daily as needed.     oxyCODONE 5 MG immediate release tablet  Commonly known as:  Oxy IR/ROXICODONE  Take 1-2 tablets (5-10 mg total) by mouth every 3 (three) hours as needed for breakthrough pain.     tamsulosin 0.4 MG Caps capsule  Commonly known as:  FLOMAX  Take 0.4 mg by mouth daily.     timolol 0.5 % ophthalmic solution  Commonly known as:  TIMOPTIC  Place 1 drop into the left eye 2 (two) times daily.      traMADol 50 MG tablet  Commonly known as:  ULTRAM  Take 1 tablet (50 mg total) by mouth every 6 (six) hours as needed. Reported on 2015-05-19        Diagnostic Studies: Dg Knee 1-2 Views Left  19-May-2015  CLINICAL DATA:  Status post left knee replacement EXAM: LEFT KNEE - 1-2 VIEW COMPARISON:  04/04/2015 FINDINGS: A left knee replacement is noted. No acute bony abnormality is seen. Air is noted in the surgical bed. IMPRESSION: Status post left knee replacement Electronically Signed   By: Alcide Clever M.D.   On: 19-May-2015 10:37   Ct Knee Left Wo Contrast  04/04/2015  CLINICAL DATA:  Left knee.  Preop planning. EXAM: CT OF THE LEFT KNEE WITHOUT CONTRAST TECHNIQUE: Multidetector CT imaging of the LEFT knee was performed according to the standard protocol. Multiplanar CT image reconstructions were also generated. COMPARISON:  None. FINDINGS: The hip demonstrates no fracture or dislocation. There is no lytic or blastic lesion. The knee demonstrates no fracture or dislocation. There is no lytic or blastic lesion. There is tricompartmental osteoarthritis of the left knee most severe in the medial femorotibial compartment and lateral patellofemoral compartment. There is a small joint effusion. The ankle demonstrates no fracture or dislocation. There is no lytic or blastic lesion. IMPRESSION: 1. Tricompartmental osteoarthritis of the left knee. Electronically Signed   By: Elige Ko   On: 04/04/2015 09:57    Disposition: Final discharge disposition not confirmed        Follow-up Information    Follow up with [facility] .   Specialty:  Skilled Nursing Facility   Contact information:   [address] [place] [address] [phone]      Follow up with MENZ,MICHAEL, MD In 2 weeks.   Specialty:  Orthopedic Surgery   Why:  For staple removal and skin check   Contact information:   [address] [facility] [place]  [address] [phone]        Signed: Amador Cunas [facility] 05/01/2015, 10:21 AM

## 2015-05-01 NOTE — Discharge Instructions (Signed)

## 2015-05-01 NOTE — Progress Notes (Addendum)
Physical Therapy Treatment Patient Details Name: Jordan Wilkins MRN: [id number] DOB: [birth year] Today's Date: 05/01/2015    History of Present Illness This patient is a 75 year old male who came to [facility] for a L TKR.    PT Comments    Pt requires strong encouragement for participation in all treatment today. Pt with poor quad set. Requires Mod A of 1-2 for bed mobility and transfers. Pt works on left knee flexion to 77 degrees actively. Pt does not wish to stand, but eventually agrees with encouragement; requires several attempts and bed elevated to attain full stand; pt tends to give up part way and sit stating "I can't." Once stand attained, pt worked on quad set on left with hold; bilateral knees flexed with stand. Pt agreeable to rehab at this point; discussed with SW. At the time of this note; SW notifies therapist pt will be transported by spouse to rehab in [place]. Will need to work on car transfer to safely allow for this. Will address with pt/spouse this afternoon.   Follow Up Recommendations  SNF     Equipment Recommendations  Rolling walker with 5" wheels    Recommendations for Other Services       Precautions / Restrictions Precautions Precautions: Fall;Knee Restrictions Weight Bearing Restrictions: Yes LLE Weight Bearing: Weight bearing as tolerated    Mobility  Bed Mobility Overal bed mobility: Needs Assistance Bed Mobility: Supine to Sit;Sit to Supine     Supine to sit: Mod assist Sit to supine: Mod assist   General bed mobility comments: Mod A for trunk and BLEs to sit. Mod A for BLEs to supine. Slow increased effort and encouragement/cueing  Transfers Overall transfer level: Needs assistance Equipment used: Rolling walker (2 wheeled) Transfers: Sit to/from Stand Sit to Stand: Mod assist;+2 safety/equipment;From elevated surface         General transfer comment: Strong encouragement and several attempts to stand; Pt repeats "I can't"; "I give  up"  Ambulation/Gait             General Gait Details: Pt refuses ambulation due to pain/difficulty    Stairs            Wheelchair Mobility    Modified Rankin (Stroke Patients Only)       Balance Overall balance assessment: Needs assistance Sitting-balance support: Feet supported Sitting balance-Leahy Scale: Good     Standing balance support: Bilateral upper extremity supported Standing balance-Leahy Scale: Fair Standing balance comment: Static stand with rw; BLEs flexed at knees; trunk slightly forward                    Cognition Arousal/Alertness: Awake/alert Behavior During Therapy: WFL for tasks assessed/performed Overall Cognitive Status: Within Functional Limits for tasks assessed                      Exercises Total Joint Exercises Ankle Circles/Pumps: AROM;Both;20 reps;Supine Quad Sets: Strengthening;Both;20 reps;Supine (LLE gravity assisted knee extension) Knee Flexion: AROM;Left;10 reps;Seated (3 postitions each rep with hold) Goniometric ROM: -6 to 77 Other Exercises Other Exercises: Static stand with L quad set 10x    General Comments        Pertinent Vitals/Pain Pain Assessment: 0-10 Pain Score: 6  Pain Location: L knee Pain Descriptors / Indicators: Aching;Constant;Operative site guarding;Tightness Pain Intervention(s): Limited activity within patient's tolerance;Monitored during session;Premedicated before session;Ice applied    Home Living  Prior Function            PT Goals (current goals can now be found in the care plan section) Progress towards PT goals: Progressing toward goals (very slowly)    Frequency  BID    PT Plan Discharge plan needs to be updated    Co-evaluation             End of Session Equipment Utilized During Treatment: Gait belt Activity Tolerance: Patient limited by pain;Patient limited by fatigue Patient left: in bed;in CPM;with bed alarm set;with  SCD's reapplied;with family/visitor present (polar care in place)     Time: 4098-1191 PT Time Calculation (min) (ACUTE ONLY): 35 min  Charges:  $Therapeutic Exercise: 8-22 mins $Therapeutic Activity: 8-22 mins                    G Codes:      Kristeen Miss, PTA 05/01/2015, 12:16 PM

## 2015-05-01 NOTE — Care Management Important Message (Signed)
Important Message  Patient Details  Name: Jordan Wilkins MRN: [id number] Date of Birth: 10/01/1940   Medicare Important Message Given:  Yes    Olegario Messier A [name] 05/01/2015, 12:13 PM

## 2015-05-01 NOTE — Progress Notes (Addendum)
PT continues to recommend SNF today. Clinical Social Worker (CSW) met with patient and his wife Arville Go was at bedside. Wife and patient are agreeable to SNF in [place], [place]. [facility] presented bed offers and they chose [facility]. CSW left admissions coordinator at [facility] a voicemail today. Wife plans on transporting patient in her personal vehicle tomorrow.  [facility] admissions coordinator called CSW back and reported that patient can come tomorrow. CSW sent D/C Summary, FL2, and D/C Packet to [facility] today via [place]. Per Nira Conn patient will go to room 19-A. CSW spoke with [facility] crew chief about EMS transport to [place], [place]. Per crew chief no money will be required up front however patient will have to D/C in the morning before 10 am. Per crew chief they cannot transport patient in the afternoon. CSW met with patient and his wife and made them aware of above. Wife is still planning on transporting patient tomorrow in personal vehicle. CSW will continue to follow and assist as needed.   Wife changed her mind and wants EMS to transport patient. EMS can transport patient no later then 10 am tomorrow. CSW attempted to call Medicare to inquire about EMS co-pays upon wife's request. CSW could not get through to Medicare. CSW made wife aware that historically Medicare will cover 80% of EMS and patient will have 20% co-pay.    Blima Rich, LCSW [phone]

## 2015-05-01 NOTE — Progress Notes (Signed)
Occupational Therapy Treatment Patient Details Name: Jordan Wilkins MRN: [id number] DOB: 1940/09/03 Today's Date: 05/01/2015    History of present illness This patient is a 75 year old male who came to [facility] for a L TKR.   OT comments  Patient willing to put shorts on, but would only do it in the bed. Needed extended time, moderate assist and verbal cues and physical cues to complete.  Follow Up Recommendations  SNF    Equipment Recommendations       Recommendations for Other Services      Precautions / Restrictions Precautions Precautions: Fall;Knee Restrictions Weight Bearing Restrictions: Yes LLE Weight Bearing: Weight bearing as tolerated       Mobility Bed Mobility  Transfers    Balance                   ADL                                         General ADL Comments: .Patient donned shorts using reacher but took a very extended time period as he would do it from the bed and not standing. Needed moderate assist and verbal cues and physical cues. Needed trapeze to reposition.       Vision                     Perception     Praxis      Cognition   Behavior During Therapy: WFL for tasks assessed/performed Overall Cognitive Status: Within Functional Limits for tasks assessed                       Extremity/Trunk Assessment               Exercises   Shoulder Instructions       General Comments      Pertinent Vitals/ Pain       Pain Assessment: 0-10 Pain Score: 8  Pain Location: L knee Pain Descriptors / Indicators: Aching;Constant;Operative site guarding;Tightness Pain Intervention(s):  (RN informed, bringing pain meds.)  Home Living                                          Prior Functioning/Environment              Frequency       Progress Toward Goals  OT Goals(current goals can now be found in the care plan section)     Acute Rehab OT Goals Patient Stated Goal:  would rather go home, but now willing to go to rehab  Plan      Co-evaluation                 End of Session Equipment Utilized During Treatment:  (reacher)   Activity Tolerance     Patient Left  (left with physical therapy.)   Nurse Communication          Time: 4098-1191 OT Time Calculation (min): 24 min  Charges: OT General Charges $OT Visit: 1 Procedure OT Treatments $Self Care/Home Management : 23-37 mins Ocie Cornfield, MS/OTR/L  Ocie Cornfield 05/01/2015, 1:56 PM

## 2015-05-01 NOTE — Clinical Social Work Placement (Signed)
   CLINICAL SOCIAL WORK PLACEMENT  NOTE  Date:  05/01/2015  Patient Details  Name: Jordan Wilkins MRN: [id number] Date of Birth: 12/10/1940  Clinical Social Work is seeking post-discharge placement for this patient at the Skilled  Nursing Facility level of care (*CSW will initial, date and re-position this form in  chart as items are completed):  Yes   Patient/family provided with [place] Clinical Social Work Department's list of facilities offering this level of care within the geographic area requested by the patient (or if unable, by the patient's family).  Yes   Patient/family informed of their freedom to choose among providers that offer the needed level of care, that participate in Medicare, Medicaid or managed care program needed by the patient, have an available bed and are willing to accept the patient.  Yes   Patient/family informed of [place]'s ownership interest in [facility] and [facility], as well as of the fact that they are under no obligation to receive care at these facilities.  PASRR submitted to EDS on  (PASARR is not needed for SNF in [place], [place] )     PASRR number received on       Existing PASRR number confirmed on       FL2 transmitted to all facilities in geographic area requested by pt/family on 05/01/15     FL2 transmitted to all facilities within larger geographic area on       Patient informed that his/her managed care company has contracts with or will negotiate with certain facilities, including the following:        Yes   Patient/family informed of bed offers received.  Patient chooses bed at  [facility] [place], [place]) )     Physician recommends and patient chooses bed at      Patient to be transferred to   on  .  Patient to be transferred to facility by       Patient family notified on   of transfer.  Name of family member notified:        PHYSICIAN       Additional Comment:     _______________________________________________ Haig Prophet, LCSW 05/01/2015, 12:15 PM

## 2015-05-01 NOTE — Progress Notes (Signed)
Physical Therapy Treatment Patient Details Name: Jordan Wilkins MRN: [id number] DOB: 1940-08-17 Today's Date: 05/01/2015    History of Present Illness This patient is a 75 year old male who came to [facility] for a L TKR.    PT Comments    Pt progressing slightly with ability to transfer bed to chair this afternoon. Pt pivots on Right lower extremity versus picking right foot up due to pain and difficulty accepting weight through Left lower extremity. Pt seated in recliner working on active range of motion flexion/stretching of left knee. Pt requires use of urinal; nurse then in to give pt a suppository. Pt notes he will need time to use the urinal. No further treatment today due to procedure/need of time to void. Pt will need to enter an SUV tomorrow for transport to skilled nursing facility. Suggest using PT session to assist pt/spouse with car transfer. May need stool to assist elevating into vehicle. It is noted that pt's right knee is quite arthritic as well.   Follow Up Recommendations  SNF     Equipment Recommendations  Rolling walker with 5" wheels (pt received and adjusted to correct height)    Recommendations for Other Services       Precautions / Restrictions Precautions Precautions: Fall;Knee Restrictions Weight Bearing Restrictions: Yes LLE Weight Bearing: Weight bearing as tolerated    Mobility  Bed Mobility Overal bed mobility: Needs Assistance Bed Mobility: Supine to Sit     Supine to sit: Mod assist Sit to supine: Mod assist   General bed mobility comments: Continues with difficulty advancing LLE over the edge of the bed due to pain. Requires assist for trunk and use of rails  Transfers Overall transfer level: Needs assistance Equipment used: Rolling walker (2 wheeled) Transfers: Sit to/from Stand Sit to Stand: From elevated surface;Mod assist         General transfer comment: Less assist requiring only 1 person assist  Ambulation/Gait Ambulation/Gait  assistance: Min assist Ambulation Distance (Feet): 3 Feet Assistive device: Rolling walker (2 wheeled) Gait Pattern/deviations: Decreased step length - right;Decreased step length - left;Decreased weight shift to left;Trendelenburg (bed to chair) Gait velocity: reduced Gait velocity interpretation: <1.8 ft/sec, indicative of risk for recurrent falls General Gait Details: Pt pivots on RLE; does not shift much weight to left despite cues to receive weight through BUEs to allow for R step   Stairs            Wheelchair Mobility    Modified Rankin (Stroke Patients Only)       Balance Overall balance assessment: Needs assistance Sitting-balance support: Feet supported Sitting balance-Leahy Scale: Good     Standing balance support: Bilateral upper extremity supported Standing balance-Leahy Scale: Fair Standing balance comment: Weight more on RLE                    Cognition Arousal/Alertness: Awake/alert Behavior During Therapy: WFL for tasks assessed/performed Overall Cognitive Status: Within Functional Limits for tasks assessed                      Exercises Total Joint Exercises Ankle Circles/Pumps: AROM;Both;20 reps;Supine Quad Sets: Strengthening;Both;20 reps;Supine (LLE gravity assisted knee extension) Knee Flexion: AROM;Left;Other reps (comment) (several minutes; pt required use of urinal) Goniometric ROM: -6 to 77 Other Exercises Other Exercises: Static stand with L quad set 10x    General Comments        Pertinent Vitals/Pain Pain Assessment: 0-10 Pain Score: 8  Pain Location: L  knee Pain Descriptors / Indicators: Aching;Constant;Operative site guarding;Tightness Pain Intervention(s): Limited activity within patient's tolerance;Patient requesting pain meds-RN notified    Home Living                      Prior Function            PT Goals (current goals can now be found in the care plan section) Acute Rehab PT Goals Patient  Stated Goal: would rather go home, but now willing to go to rehab Progress towards PT goals: Progressing toward goals (slowly)    Frequency  BID    PT Plan Discharge plan needs to be updated    Co-evaluation             End of Session Equipment Utilized During Treatment: Gait belt Activity Tolerance: Patient limited by pain Patient left: in chair;with call bell/phone within reach;with nursing/sitter in room;with family/visitor present (nursing to provide care and will set pt alarm/polar care)     Time: 1610-9604 PT Time Calculation (min) (ACUTE ONLY): 14 min  Charges:  $Gait Training: 8-22 mins $Therapeutic Exercise: 8-22 mins $Therapeutic Activity: 8-22 mins                    G Codes:      Kristeen Miss 05/01/2015, 2:45 PM

## 2015-05-01 NOTE — Progress Notes (Signed)
   Subjective: 2 Days Post-Op Procedure(s) (LRB): TOTAL KNEE ARTHROPLASTY (Left) Patient reports pain as 6 on 0-10 scale.   Patient is well, and has had no acute complaints or problems Denies any CP, SOB, ABD pain. We will continue therapy today.  Plan is to go Rehab after hospital stay.  Objective: Vital signs in last 24 hours: Temp:  [97.9 F (36.6 C)-98.5 F (36.9 C)] 98.5 F (36.9 C) (02/16 0252) Pulse Rate:  [73-142] 73 (02/16 0253) Resp:  [19-21] 21 (02/16 0252) BP: (107-141)/(54-77) 107/58 mmHg (02/16 0252) SpO2:  [91 %-95 %] 93 % (02/16 0253)  Intake/Output from previous day: 02/15 0701 - 02/16 0700 In: 1693.8 [P.O.:840; I.V.:853.8] Out: 1885 [Urine:1885] Intake/Output this shift:     Recent Labs  04/29/15 1119 04/30/15 0512 05/01/15 0449  HGB 13.3 12.8* 11.5*    Recent Labs  04/30/15 0512 05/01/15 0449  WBC 11.9* 11.1*  RBC 4.22* 3.74*  HCT 39.0* 33.5*  PLT 153 137*    Recent Labs  04/30/15 0512 05/01/15 0449  NA 138 134*  K 4.1 3.5  CL 106 98*  CO2 26 30  BUN 14 17  CREATININE 0.86 1.10  GLUCOSE 120* 120*  CALCIUM 8.5* 8.4*   No results for input(s): LABPT, INR in the last 72 hours.  EXAM General - Patient is Alert, Appropriate and Oriented Extremity - ABD soft Neurovascular intact Sensation intact distally Intact pulses distally Dorsiflexion/Plantar flexion intact Dressing - dressing C/D/I and no drainage, dressing changed Motor Function - intact, moving foot and toes well on exam.   Past Medical History  Diagnosis Date  . Difficulty urinating   . Macular degeneration     both    Assessment/Plan:   2 Days Post-Op Procedure(s) (LRB): TOTAL KNEE ARTHROPLASTY (Left) Active Problems:   Primary osteoarthritis of knee   Acute post op blood loss anemia    Estimated body mass index is 35.87 kg/(m^2) as calculated from the following:   Height as of this encounter:  (1.778 m).   Weight as of this encounter: 113.399 kg (250  lb). Advance diet Up with therapy  Needs BM Plan on discharge to rehab Friday  DVT Prophylaxis - Lovenox, Foot Pumps and TED hose Weight-Bearing as tolerated to left leg D/C O2 and Pulse OX and try on Room Air  T. Cranston Neighbor, PA-C [facility] 05/01/2015, 7:34 AM

## 2015-05-02 NOTE — Clinical Social Work Placement (Signed)
   CLINICAL SOCIAL WORK PLACEMENT  NOTE  Date:  05/02/2015  Patient Details  Name: Jordan Wilkins MRN: [id number] Date of Birth: [birth year]  Clinical Social Work is seeking post-discharge placement for this patient at the Skilled  Nursing Facility level of care (*CSW will initial, date and re-position this form in  chart as items are completed):  Yes   Patient/family provided with [place] Clinical Social Work Department's list of facilities offering this level of care within the geographic area requested by the patient (or if unable, by the patient's family).  Yes   Patient/family informed of their freedom to choose among providers that offer the needed level of care, that participate in Medicare, Medicaid or managed care program needed by the patient, have an available bed and are willing to accept the patient.  Yes   Patient/family informed of [facility]'s ownership interest in [facility] and [facility], as well as of the fact that they are under no obligation to receive care at these facilities.  PASRR submitted to EDS on  (PASARR is not needed for SNF in [place], [place] )     PASRR number received on       Existing PASRR number confirmed on       FL2 transmitted to all facilities in geographic area requested by pt/family on 05/01/15     FL2 transmitted to all facilities within larger geographic area on       Patient informed that his/her managed care company has contracts with or will negotiate with certain facilities, including the following:        Yes   Patient/family informed of bed offers received.  Patient chooses bed at  [facility] [place], [place]) )     Physician recommends and patient chooses bed at      Patient to be transferred to  [facility] [place], [place]) ) on 05/02/15.  Patient to be transferred to facility by  [facility] )     Patient family notified on 05/02/15 of transfer.  Name of family member notified:   (Patient's wife Chyrl Civatte is at  bedside and aware of D/C today. )     PHYSICIAN       Additional Comment:    _______________________________________________ Haig Prophet, LCSW 05/02/2015, 8:38 AM

## 2015-05-02 NOTE — Care Management (Signed)
I have cancelled home health services with [facility] and I have also cancelled patient's Lovenox order with [facility]. No further RNCM needs. CSW will follow for SNF.

## 2015-05-02 NOTE — Progress Notes (Signed)
   Subjective: 3 Days Post-Op Procedure(s) (LRB): TOTAL KNEE ARTHROPLASTY (Left) Patient reports pain as 6 on 0-10 scale.   Patient is well, and has had no acute complaints or problems Denies any CP, SOB, ABD pain. We will continue therapy today.  Plan is to go Rehab after hospital stay.  Objective: Vital signs in last 24 hours: Temp:  [97.3 F (36.3 C)-98.6 F (37 C)] 97.9 F (36.6 C) (02/17 0356) Pulse Rate:  [69-87] 74 (02/17 0356) Resp:  [18] 18 (02/17 0356) BP: (105-123)/(40-59) 117/59 mmHg (02/17 0356) SpO2:  [91 %-99 %] 92 % (02/17 0356)  Intake/Output from previous day: 02/16 0701 - 02/17 0700 In: 720 [P.O.:720] Out: 650 [Urine:650] Intake/Output this shift:     Recent Labs  04/29/15 1119 04/30/15 0512 05/01/15 0449  HGB 13.3 12.8* 11.5*    Recent Labs  04/30/15 0512 05/01/15 0449  WBC 11.9* 11.1*  RBC 4.22* 3.74*  HCT 39.0* 33.5*  PLT 153 137*    Recent Labs  04/30/15 0512 05/01/15 0449  NA 138 134*  K 4.1 3.5  CL 106 98*  CO2 26 30  BUN 14 17  CREATININE 0.86 1.10  GLUCOSE 120* 120*  CALCIUM 8.5* 8.4*   No results for input(s): LABPT, INR in the last 72 hours.  EXAM General - Patient is Alert, Appropriate and Oriented Extremity - ABD soft Neurovascular intact Sensation intact distally Intact pulses distally Dorsiflexion/Plantar flexion intact Dressing - dressing C/D/I and no drainage, dressing changed Motor Function - intact, moving foot and toes well on exam.   Past Medical History  Diagnosis Date  . Difficulty urinating   . Macular degeneration     both    Assessment/Plan:   3 Days Post-Op Procedure(s) (LRB): TOTAL KNEE ARTHROPLASTY (Left) Active Problems:   Primary osteoarthritis of knee   Acute post op blood loss anemia    Estimated body mass index is 35.87 kg/(m^2) as calculated from the following:   Height as of this encounter:  (1.778 m).   Weight as of this encounter: 113.399 kg (250 lb). Advance diet Up  with therapy  Plan on discharge to rehab today Follow up with [facility] in 2 weeks  DVT Prophylaxis - Lovenox, Foot Pumps and TED hose Weight-Bearing as tolerated to left leg D/C O2 and Pulse OX and try on Room Air  T. Cranston Neighbor, PA-C [facility] 05/02/2015, 7:33 AM

## 2015-05-02 NOTE — Progress Notes (Signed)
Called report to [facility] in [place]. IV's removed, belongings packed, and IV's removed. VSS at time of discharge.

## 2015-05-02 NOTE — Progress Notes (Signed)
Patient is medically stable for D/C to [facility] in [place], [place] today. Clinical Child psychotherapist (CSW) sent D/C Summary, FL2, D/C Packet to [facility] admissions coordinator yesterday via [facility]. CSW faxed prescriptions to [facility] today. Per Herbert Seta patient will go to room 19-A. RN will call report and arrange EMS for transport. Patient and patient's wife Jordan Wilkins are aware of above. Wife is at bedside. Please reconsult if future social work needs arise. CSW signing off.   Jetta Lout, LCSW [phone]

## 2015-05-02 NOTE — Progress Notes (Signed)
Physical Therapy Treatment Patient Details Name: Jordan Wilkins MRN: [id number] DOB: 1940/04/26 Today's Date: 05/02/2015    History of Present Illness This patient is a 75 year old male who came to [facility] for a L TKR.    PT Comments    Pt is pain limited with most acts, especially with ROM and against gravity acts.  He shows good effort t/o the session but ultimately is very weak and struggles with standing showing b/l knee bent/buckling and heavy reliance on UEs that fatigue with minimal ambulation.  Pt needed max assist just to make it around EOB as he was unable to keep knees from bending and stand upright using walker.  Pt with considerable pain with most acts (including L non-operative knee) and ultimately is quite limited despite his desire to do much more.    Follow Up Recommendations  SNF     Equipment Recommendations  Rolling walker with 5" wheels    Recommendations for Other Services       Precautions / Restrictions Precautions Precautions: Fall;Knee Restrictions Weight Bearing Restrictions: Yes LLE Weight Bearing: Weight bearing as tolerated    Mobility  Bed Mobility Overal bed mobility: Needs Assistance Bed Mobility: Supine to Sit;Sit to Supine     Supine to sit: Min assist;Mod assist Sit to supine: Min assist;Mod assist   General bed mobility comments: Pt is guarded and cautious with mobility, needs assist with L LE to protect and to physically move it.  Pt shows good effort but is pain and strength limited.   Transfers Overall transfer level: Needs assistance Equipment used: Rolling walker (2 wheeled) Transfers: Sit to/from Stand Sit to Stand: Min assist         General transfer comment: Pt needs excessive cuing and assist with set up and though he makes good effort getting to standing he does need assist and a lot of cuing.   Ambulation/Gait Ambulation/Gait assistance: Max assist;Mod assist;Min assist Ambulation Distance (Feet): 11 Feet Assistive  device: Rolling walker (2 wheeled)       General Gait Details: Pt is able to take a few intial steps with min assist, but slowly he becomes more and more fatigued and b/l knees bend and semi buckle.  He has audible crepitus with L knee and is unable to use it to really be his primary stability. Pt barely makes it around the foot of the bed before needing to quickly sit down on the bed.  He is unsafe and by the end was needed heavy max assist just to stay upright.   Stairs            Wheelchair Mobility    Modified Rankin (Stroke Patients Only)       Balance                                    Cognition Arousal/Alertness: Awake/alert Behavior During Therapy: WFL for tasks assessed/performed Overall Cognitive Status: Within Functional Limits for tasks assessed                      Exercises Total Joint Exercises Ankle Circles/Pumps: AROM;Both;20 reps;Supine Quad Sets: Strengthening;10 reps Gluteal Sets: Strengthening;10 reps Short Arc Quad: AROM;AAROM;10 reps Heel Slides: PROM;AAROM;10 reps Hip ABduction/ADduction: AAROM;10 reps;AROM Straight Leg Raises: PROM;AROM;10 reps Knee Flexion: PROM;Left;5 reps Goniometric ROM: 5-82    General Comments        Pertinent Vitals/Pain Pain Score: 3  Pain Location: pain increases greatly with an activity    Home Living                      Prior Function            PT Goals (current goals can now be found in the care plan section) Progress towards PT goals: Progressing toward goals    Frequency  BID    PT Plan Current plan remains appropriate    Co-evaluation             End of Session Equipment Utilized During Treatment: Gait belt Activity Tolerance: Patient limited by pain;Patient limited by fatigue Patient left: with bed alarm set;with call bell/phone within reach     Time: 0820-0917 PT Time Calculation (min) (ACUTE ONLY): 57 min  Charges:  $Gait Training: 8-22  mins $Therapeutic Exercise: 23-37 mins $Therapeutic Activity: 8-22 mins                    G Codes:     [name], PT, DPT [phone]  Malachi Pro 05/02/2015, 11:21 AM

## 2015-05-15 NOTE — Care Management (Signed)
Post discharge: Received call from patient's wife stating that "she is not able to get a walker for her husband/patient who is discharging from [facility] today". She states that "he received a walker from [facility] on 04/30/15 and she was told not to take it because he was going to [facility]". I have asked Will with [facility] to provide walker to this patient today at [facility] since they have a a store in [place]". Will with [facility] agrees to contacting patient's wife and [facility] to discuss options with [facility] since this is company that charged walker to his account. No further RNCM needs.

## 2015-05-16 NOTE — Care Management (Signed)
Post discharge: [facility] stating that patient's wife Jordan Wilkins [phone] has called very upset that she still does not have a rolling walker for her husband in the home. [facility] loaned their walker but of course [facility] wants their walker back. I spoke again with Will at [facility] and he states that he spoke with [employer] Sales promotion account executive(director- did not remember name) yesterday explaining that walker had been credited to patient account however it could take several days to show credit in order for Medicare to pay for a new walker..   Will states that he did order a walker to have delivered to patient's home and it should arrive this weekend (patient lives [place] minutes from closest [facility] retail store).  Patient's wife had left a message on on 1A voice mail. I have again spoken with patient's wife Jordan [facility]kins and [facility] was being [facility] yesterday- however she states [facility] did not tell her that. I have asked her to call this RNCM back with results of walker delivery from [facility]. She states her husband is progressing and [facility] [place]. RNCM gave her my direct number. I will follow up with her on Monday as walker should be delivered by that time. She agreed.

## 2015-05-19 NOTE — Care Management (Signed)
Post discharge follow up on rolling walker delivery: [facility] [phone] stating that patient received rolling walker from [facility] on Friday 05/16/15 after we talked. No further RNCM needs. Case closed.

## 2017-10-10 IMAGING — DX DG KNEE 1-2V*L*
2 series · 2 of 2 positions shown · non-contrast
Comparison: 04/04/2015

CLINICAL DATA: Status post left knee replacement

EXAM:
LEFT KNEE - 1-2 VIEW

[knee ap]
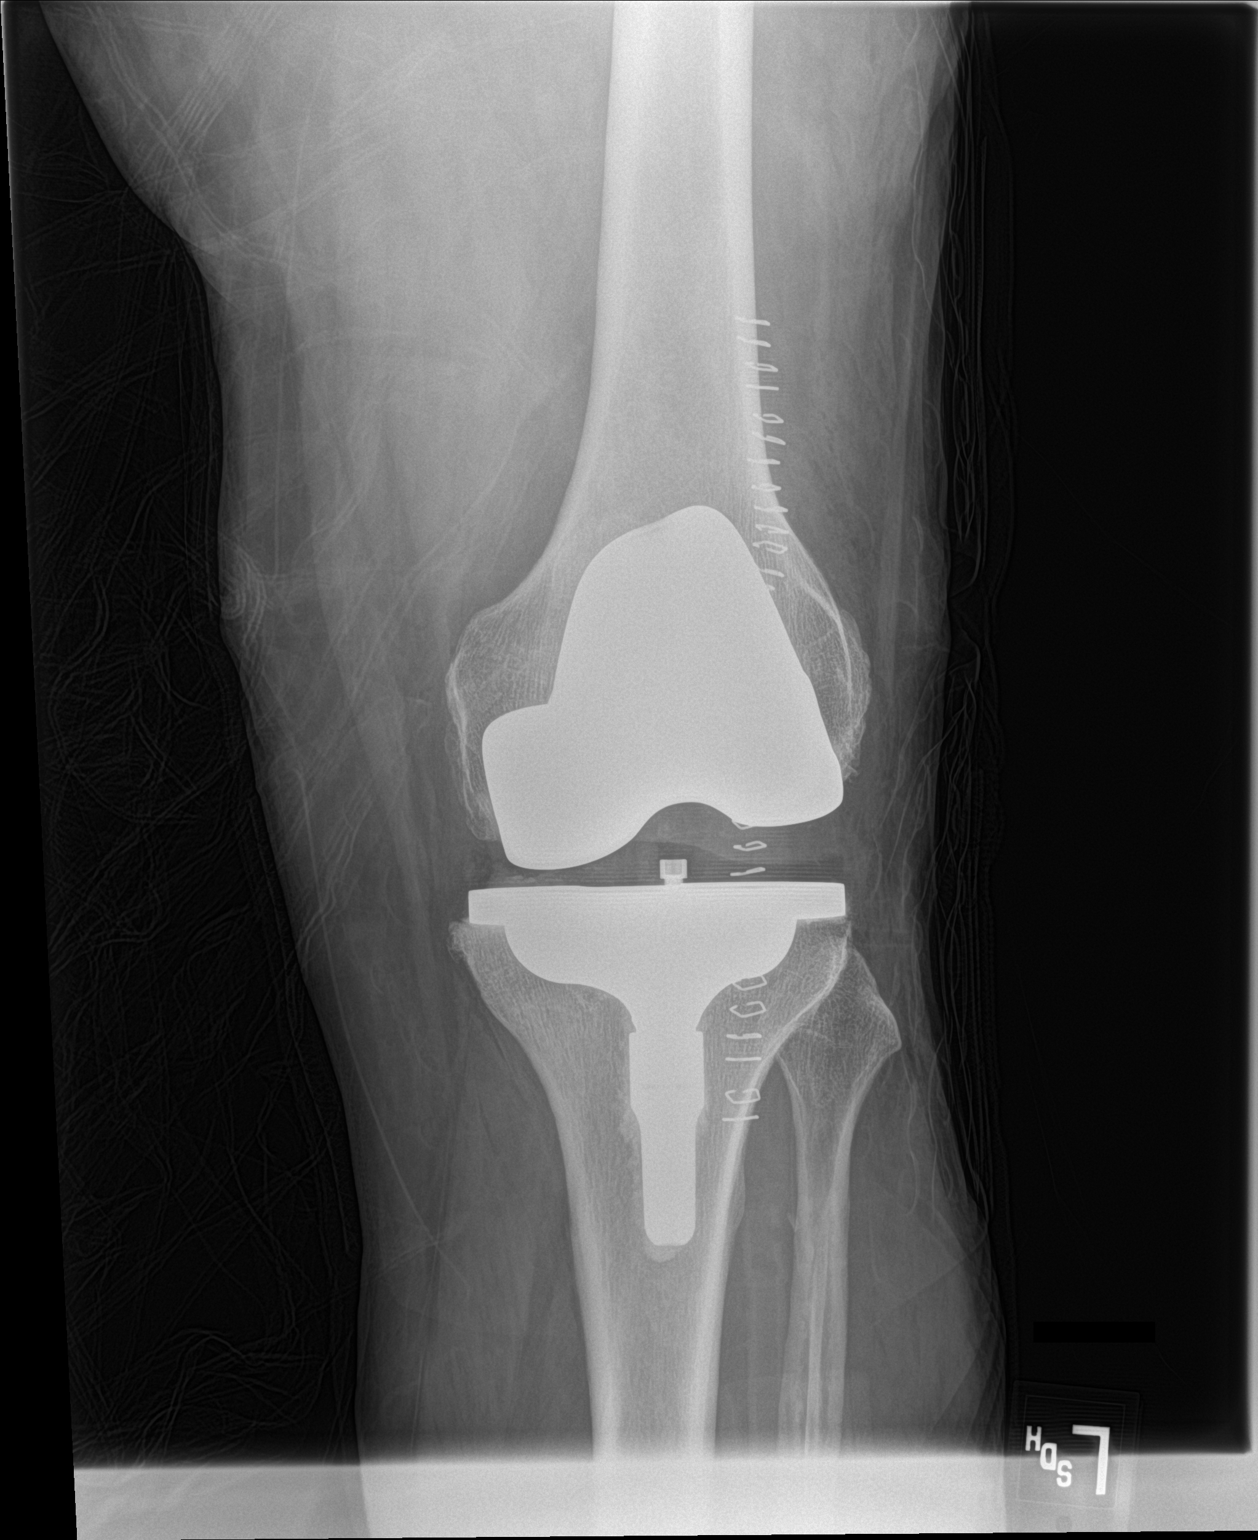

[knee lat]
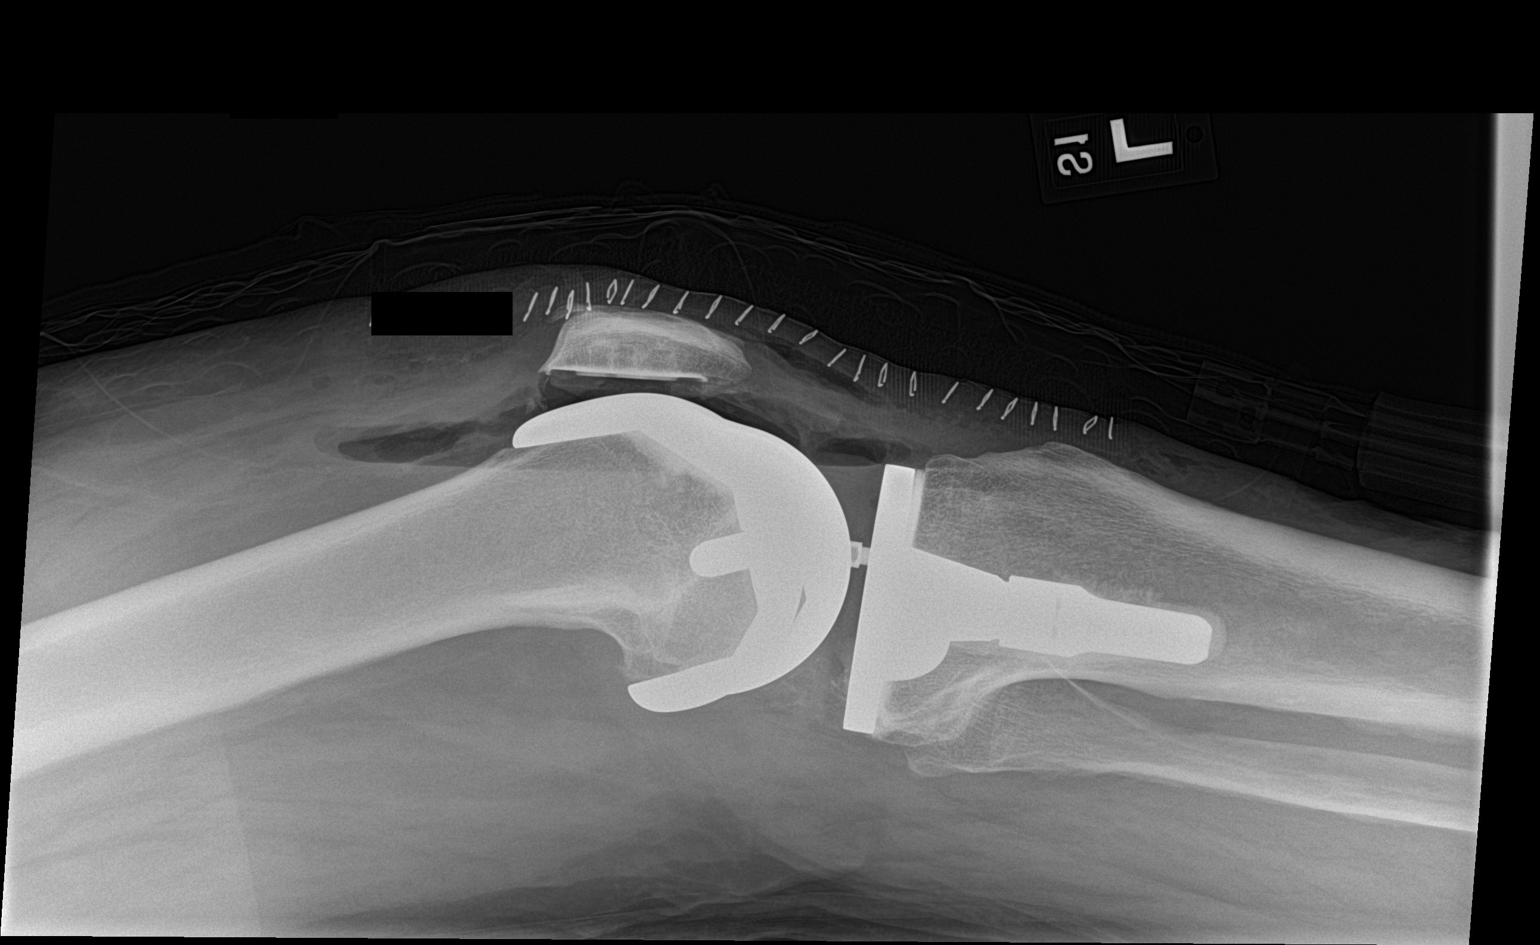

[2 of 2 positions shown; findings below may reference images not displayed]

FINDINGS: A left knee replacement is noted. No acute bony abnormality is seen.
Air is noted in the surgical bed.
IMPRESSION: Status post left knee replacement
